# Patient Record
Sex: Male | Born: 1950 | Race: Black or African American | Hispanic: No | Marital: Married | Smoking: Never smoker
Health system: Southern US, Community
[De-identification: ages and names within clinical notes are randomized; demographics above are authoritative.]

## PROBLEM LIST (undated history)

## (undated) DIAGNOSIS — I10 Essential (primary) hypertension: Secondary | ICD-10-CM

## (undated) DIAGNOSIS — D573 Sickle-cell trait: Secondary | ICD-10-CM

## (undated) DIAGNOSIS — E119 Type 2 diabetes mellitus without complications: Secondary | ICD-10-CM

## (undated) HISTORY — PX: ORIF FEMUR FRACTURE: SHX2119

## (undated) HISTORY — PX: COLONOSCOPY: SHX174

## (undated) HISTORY — PX: POLYPECTOMY: SHX149

## (undated) HISTORY — DX: Sickle-cell trait: D57.3

---

## 2003-01-13 ENCOUNTER — Emergency Department (HOSPITAL_COMMUNITY): Admission: EM | Admit: 2003-01-13 | Discharge: 2003-01-13 | Payer: Self-pay | Admitting: Emergency Medicine

## 2003-01-13 ENCOUNTER — Encounter: Payer: Self-pay | Admitting: Family Medicine

## 2008-10-06 ENCOUNTER — Encounter: Admission: RE | Admit: 2008-10-06 | Discharge: 2008-10-06 | Payer: Self-pay | Admitting: Family Medicine

## 2010-11-11 ENCOUNTER — Other Ambulatory Visit: Payer: Self-pay | Admitting: Family Medicine

## 2010-11-11 ENCOUNTER — Ambulatory Visit
Admission: RE | Admit: 2010-11-11 | Discharge: 2010-11-11 | Disposition: A | Discharge status: Home / Self Care | Payer: BC Managed Care – PPO | Source: Ambulatory Visit | Attending: Family Medicine | Admitting: Family Medicine

## 2010-11-11 DIAGNOSIS — I10 Essential (primary) hypertension: Secondary | ICD-10-CM

## 2011-08-10 ENCOUNTER — Encounter (HOSPITAL_COMMUNITY): Payer: Self-pay | Admitting: *Deleted

## 2011-08-10 ENCOUNTER — Emergency Department (INDEPENDENT_AMBULATORY_CARE_PROVIDER_SITE_OTHER)
Admission: EM | Admit: 2011-08-10 | Discharge: 2011-08-10 | Disposition: A | Discharge status: Home / Self Care | Payer: BC Managed Care – PPO | Source: Home / Self Care | Attending: Emergency Medicine | Admitting: Emergency Medicine

## 2011-08-10 DIAGNOSIS — L02219 Cutaneous abscess of trunk, unspecified: Secondary | ICD-10-CM

## 2011-08-10 DIAGNOSIS — L02214 Cutaneous abscess of groin: Secondary | ICD-10-CM

## 2011-08-10 MED ORDER — CHLORHEXIDINE GLUCONATE 4 % EX LIQD: 60.0000 mL | Freq: Every day | CUTANEOUS | Status: AC | PRN

## 2011-08-10 MED ORDER — BACITRACIN 500 UNIT/GM EX OINT: 1.0000 "application " | TOPICAL_OINTMENT | Freq: Once | CUTANEOUS | Status: DC

## 2011-08-10 MED ORDER — HYDROCODONE-ACETAMINOPHEN 5-325 MG PO TABS: 2.0000 | ORAL_TABLET | ORAL | Status: AC | PRN

## 2011-08-10 MED ORDER — IBUPROFEN 600 MG PO TABS: 600.0000 mg | ORAL_TABLET | Freq: Four times a day (QID) | ORAL | Status: AC | PRN

## 2011-08-10 MED ORDER — DOXYCYCLINE HYCLATE 100 MG PO CAPS: 100.0000 mg | ORAL_CAPSULE | Freq: Two times a day (BID) | ORAL | Status: AC

## 2011-08-10 NOTE — ED Provider Notes (Signed)
History     CSN: [id number]  Arrival date & time 08/10/11  1737   First MD Initiated Contact with Patient 08/10/11 1738      Chief Complaint  Patient presents with  . Abscess    (Consider location/radiation/quality/duration/timing/severity/associated sxs/prior treatment) HPI Comments: Patient presents with 2 small, painful erythematous, pustules in his left groin starting several days ago. Does not recall any trauma to the area. States that he "popped" them, with some purulent drainage. No other lesions anywhere else. No nausea, vomiting, urinary complaints, penile discharge. Similar lesions before, but they usually drain and resolve on their own. Patient is not diabetic.   ROS as noted in HPI. All other ROS negative.   Patient is a 61 y.o. male presenting with abscess. The history is provided by the patient. No language interpreter was used.  Abscess  This is a recurrent problem. The current episode started less than one week ago. The onset was gradual. The abscess is present on the groin. The abscess is characterized by itchiness and redness. The patient was exposed to OTC medications. The abscess first occurred at home. Pertinent negatives include no fever.    History reviewed. No pertinent past medical history.  History reviewed. No pertinent past surgical history.  No family history on file.  History  Substance Use Topics  . Smoking status: Never Smoker   . Smokeless tobacco: Not on file  . Alcohol Use: No      Review of Systems  Constitutional: Negative for fever.    Allergies  Review of patient's allergies indicates no known allergies.  Home Medications   Current Outpatient Rx  Name Route Sig Dispense Refill  . CHLORHEXIDINE GLUCONATE 4 % EX LIQD Topical Apply 60 mLs (4 application total) topically daily as needed. Use daily when bathing for 1-2 weeks 120 mL 0  . DOXYCYCLINE HYCLATE 100 MG PO CAPS Oral Take 1 capsule (100 mg total) by mouth 2 (two) times  daily. 20 capsule 0  . HYDROCODONE-ACETAMINOPHEN 5-325 MG PO TABS Oral Take 2 tablets by mouth every 4 (four) hours as needed for pain. 20 tablet 0  . IBUPROFEN 600 MG PO TABS Oral Take 1 tablet (600 mg total) by mouth every 6 (six) hours as needed for pain. 30 tablet 0    BP 121/74  Pulse 76  Temp(Src) 97.8 F (36.6 C) (Oral)  Resp 16  SpO2 97%  Physical Exam  Nursing note and vitals reviewed. Constitutional: He is oriented to person, place, and time. He appears well-developed and well-nourished.  HENT:  Head: Normocephalic and atraumatic.  Eyes: Conjunctivae and EOM are normal.  Neck: Normal range of motion.  Cardiovascular: Normal rate.   Pulmonary/Chest: Effort normal. No respiratory distress.  Abdominal: He exhibits no distension.  Genitourinary: Penis normal.    Uncircumcised. No penile erythema or penile tenderness. No discharge found.  Musculoskeletal: Normal range of motion.  Lymphadenopathy:       Left: No inguinal adenopathy present.  Neurological: He is alert and oriented to person, place, and time.  Skin: Skin is warm and dry. No rash noted.  Psychiatric: He has a normal mood and affect. His behavior is normal.    ED Course  INCISION AND DRAINAGE Date/Time: 08/10/2011 7:43 PM Performed by: Luiz Blare Authorized by: Luiz Blare Consent: Verbal consent obtained. Risks and benefits: risks, benefits and alternatives were discussed Consent given by: patient Patient understanding: patient states understanding of the procedure being performed Patient consent: the patient's understanding of  the procedure matches consent given Site marked: the operative site was marked Required items: required blood products, implants, devices, and special equipment available Patient identity confirmed: verbally with patient Type: abscess Location: left groin. Local anesthetic: co-phenylcaine spray Patient sedated: no Scalpel size: 11 Incision type: single  straight Complexity: simple Drainage: purulent Drainage amount: scant Wound treatment: wound left open Patient tolerance: Patient tolerated the procedure well with no immediate complications. Comments: Bacitracin and sterile dressing applied   (including critical care time)  Labs Reviewed - No data to display No results found.   1. Abscess of groin, left     MDM  2 small furuncles in groin. Doxycycline, warm compresses, pain medication. Sent off wound culture. He is to followup with Dr. Parke Simmers or here in several days if no significant improvement. Patient agrees with plan  Luiz Blare, MD 08/13/11 2136

## 2011-08-10 NOTE — Discharge Instructions (Signed)
Return here or follow up with your doctor in 2 days for a wound check. Return sooner if you get worse, have a fever >100.4, or any other concerns. Take the medication as written. Take 1 gram of tylenol with the motrin up to 4 times a day as needed for pain and fever. This is an effective combination for pain. Take the hydrocodone/norco only for severe pain. Do not take the tylenol and hydrocodone/norco as they both have tylenol in them and too much can hurt your liver. Return if you get worse, have a persistent fever >100.4, or for any concerns.   

## 2011-08-10 NOTE — ED Notes (Signed)
Pt is here with complaints of "boil" on left groin.  Pt states area had two pimple like bumps that he popped the night before last.  Area now appears crusted.

## 2014-01-20 ENCOUNTER — Ambulatory Visit
Admission: RE | Admit: 2014-01-20 | Discharge: 2014-01-20 | Disposition: A | Discharge status: Home / Self Care | Payer: BC Managed Care – PPO | Source: Ambulatory Visit | Attending: Family Medicine | Admitting: Family Medicine

## 2014-01-20 ENCOUNTER — Other Ambulatory Visit: Payer: Self-pay | Admitting: Family Medicine

## 2014-01-20 DIAGNOSIS — R062 Wheezing: Secondary | ICD-10-CM

## 2014-01-20 DIAGNOSIS — R05 Cough: Secondary | ICD-10-CM

## 2014-01-20 DIAGNOSIS — R059 Cough, unspecified: Secondary | ICD-10-CM

## 2014-11-23 ENCOUNTER — Encounter (HOSPITAL_COMMUNITY): Payer: Self-pay | Admitting: Emergency Medicine

## 2014-11-23 ENCOUNTER — Emergency Department (INDEPENDENT_AMBULATORY_CARE_PROVIDER_SITE_OTHER)
Admission: EM | Admit: 2014-11-23 | Discharge: 2014-11-23 | Disposition: A | Discharge status: Home / Self Care | Payer: BLUE CROSS/BLUE SHIELD | Source: Home / Self Care | Attending: Family Medicine | Admitting: Family Medicine

## 2014-11-23 DIAGNOSIS — N4889 Other specified disorders of penis: Secondary | ICD-10-CM

## 2014-11-23 DIAGNOSIS — N489 Disorder of penis, unspecified: Secondary | ICD-10-CM

## 2014-11-23 HISTORY — DX: Type 2 diabetes mellitus without complications: E11.9

## 2014-11-23 HISTORY — DX: Essential (primary) hypertension: I10

## 2014-11-23 MED ORDER — CLINDAMYCIN HCL 300 MG PO CAPS
300.0000 mg | ORAL_CAPSULE | Freq: Three times a day (TID) | ORAL | Status: DC
Start: 2014-11-23 — End: 2016-11-08

## 2014-11-23 NOTE — Discharge Instructions (Signed)
You have developed an infected sore that will require antibiotics to heal. You can use vitamin E ointment to speed up the healing process. In the future just use Neosporin or bacitracin ointment whenever these start.

## 2014-11-23 NOTE — ED Provider Notes (Signed)
CSN: [id number]     Arrival date & time 11/23/14  1531 History   First MD Initiated Contact with Patient 11/23/14 1645     Chief Complaint  Patient presents with  . Abscess   (Consider location/radiation/quality/duration/timing/severity/associated sxs/prior Treatment) HPI  Penile lesion: Present for 1 week. Constant. Open and painful. Over-the-counter ointment without improvement. Family states that it was probably not an antibiotic ointment. Denies any groin lumps bumps, unintentional weight loss, night sweats, fevers, abdominal pain, penile discharge, nausea, vomiting, constipation, no diarrhea. Problem is nonradiating. Single lesion on the dorsum of the penis.  Past Medical History  Diagnosis Date  . Hypertension   . Diabetes mellitus without complication    History reviewed. No pertinent past surgical history. Family History  Problem Relation Age of Onset  . Asthma Neg Hx   . Cancer Neg Hx   . Diabetes Neg Hx   . Heart failure Neg Hx   . Hyperlipidemia Neg Hx    Social History  Substance Use Topics  . Smoking status: Never Smoker   . Smokeless tobacco: None  . Alcohol Use: No    Review of Systems  Allergies  Review of patient's allergies indicates no known allergies.  Home Medications   Prior to Admission medications   Medication Sig Start Date End Date Taking? Authorizing Provider  clindamycin (CLEOCIN) 300 MG capsule Take 1 capsule (300 mg total) by mouth 3 (three) times daily. 11/23/14   Waldemar Dickens, MD   Meds Ordered and Administered this Visit  Medications - No data to display  BP 121/69 mmHg  Pulse 68  Temp(Src) 97.4 F (36.3 C) (Oral)  Resp 18  SpO2 95% No data found.   Physical Exam Physical Exam  Constitutional: oriented to person, place, and time. appears well-developed and well-nourished. No distress.  HENT:  Head: Normocephalic and atraumatic.  Eyes: EOMI. PERRL.  Neck: Normal range of motion.  Cardiovascular: RRR, no m/r/g, 2+ distal  pulses,  Pulmonary/Chest: Effort normal and breath sounds normal. No respiratory distress.  Abdominal: Soft. Bowel sounds are normal. NonTTP, no distension.  Musculoskeletal: Normal range of motion. Non ttp, no effusion.  Neurological: alert and oriented to person, place, and time.  Skin: Skin is warm. No rash noted. non diaphoretic.  Psychiatric: normal mood and affect. behavior is normal. Judgment and thought content normal.  GU: No groin adenopathy, circumcised, no penile discharge, testicles normal, single 1 x 1.5 cm ulcerated lesion on the dorsum of the proximal he now shaft.  ED Course  Procedures (including critical care time)  Labs Review Labs Reviewed  CYTOLOGY, (ORAL, ANAL, URETHRAL) ANCILLARY ONLY    Imaging Review No results found.   Visual Acuity Review  Right Eye Distance:   Left Eye Distance:   Bilateral Distance:    Right Eye Near:   Left Eye Near:    Bilateral Near:         MDM   1. Penile lesion    Folliculitis which has turned into open draining abscess versus HSV. HSV culture sent. Start clindamycin.    Waldemar Dickens, MD 11/23/14 [phone]

## 2014-11-23 NOTE — ED Notes (Signed)
C/o abscess in groin area for four days  States area did drain with red pus States area is red

## 2014-11-24 NOTE — ED Notes (Signed)
New lab orders in place, discussed w Maudie Mercury in lab

## 2014-11-24 NOTE — ED Notes (Signed)
procedural collection issues required reorder of lab study

## 2014-11-26 LAB — HERPES SIMPLEX VIRUS CULTURE
Culture: NOT DETECTED
Special Requests: NORMAL

## 2014-12-08 NOTE — ED Notes (Signed)
Lab results negative, no further action required

## 2016-09-18 ENCOUNTER — Encounter: Payer: Self-pay | Admitting: Internal Medicine

## 2016-09-18 ENCOUNTER — Ambulatory Visit: Payer: Self-pay | Attending: Internal Medicine | Admitting: Internal Medicine

## 2016-09-18 VITALS — BP 169/87 | HR 60 | Temp 98.4°F | Resp 16 | Wt 180.0 lb

## 2016-09-18 DIAGNOSIS — Z1211 Encounter for screening for malignant neoplasm of colon: Secondary | ICD-10-CM

## 2016-09-18 DIAGNOSIS — Z125 Encounter for screening for malignant neoplasm of prostate: Secondary | ICD-10-CM

## 2016-09-18 DIAGNOSIS — R109 Unspecified abdominal pain: Secondary | ICD-10-CM

## 2016-09-18 DIAGNOSIS — E119 Type 2 diabetes mellitus without complications: Secondary | ICD-10-CM

## 2016-09-18 DIAGNOSIS — M1990 Unspecified osteoarthritis, unspecified site: Secondary | ICD-10-CM | POA: Insufficient documentation

## 2016-09-18 DIAGNOSIS — M25562 Pain in left knee: Secondary | ICD-10-CM | POA: Insufficient documentation

## 2016-09-18 DIAGNOSIS — Z8249 Family history of ischemic heart disease and other diseases of the circulatory system: Secondary | ICD-10-CM | POA: Insufficient documentation

## 2016-09-18 DIAGNOSIS — Z833 Family history of diabetes mellitus: Secondary | ICD-10-CM | POA: Insufficient documentation

## 2016-09-18 DIAGNOSIS — I10 Essential (primary) hypertension: Secondary | ICD-10-CM

## 2016-09-18 DIAGNOSIS — G8929 Other chronic pain: Secondary | ICD-10-CM | POA: Insufficient documentation

## 2016-09-18 LAB — GLUCOSE, POCT (MANUAL RESULT ENTRY): POC Glucose: 98 mg/dl (ref 70–99)

## 2016-09-18 LAB — POCT GLYCOSYLATED HEMOGLOBIN (HGB A1C): HEMOGLOBIN A1C: 5.1

## 2016-09-18 MED ORDER — LISINOPRIL 10 MG PO TABS: 10.0000 mg | ORAL_TABLET | Freq: Every day | ORAL | 3 refills | Status: DC

## 2016-09-18 MED ORDER — DICLOFENAC SODIUM 1 % TD GEL: 2.0000 g | Freq: Four times a day (QID) | TRANSDERMAL | 3 refills | Status: DC

## 2016-09-18 MED FILL — DICLOFENAC SODIUM 1% GEL: 1 | 12 days supply | Qty: 100 | Fill #0

## 2016-09-18 MED FILL — ?LISINOPRIL 10 MG TABLET: 10 | 30 days supply | Qty: 30 | Fill #0

## 2016-09-18 NOTE — Progress Notes (Signed)
Patient ID: Benjamin Nunez, male    DOB: October 10, 1950  MRN: [id number]  CC: New Patient (Initial Visit)   Subjective: Benjamin Nunez is a 66 y.o. male who presents for new pt visit. PCP was Dr. Criss Rosales. Last seen 1 yr ago. Loss insurance. His concerns today include:  Hx of HTN, DM, chronic LT knee pain due to arthritis.  1. HTN: -was on an ARB/HCTZ combo. Out of meds x 1 yr -does not check BP -he limits salt in food. -endorses HA/dizziness about QOWk. -no LE edema/CP/SOB  2. DM or pre-DM: dx 2-3 yrs. Never on meds. Always diet control. No glucometer to check blood sugars. Very careful with his foot choices. He avoids sweet drinks -walking 3-4 blocks going and coming in neighborhood QOD.  -some twitching in LT eye lid. No blurred vision. -last eye exam was 1 yr ago.   3. Intermittent pain RT flank -no initiating factors identify. -flare last evening but not hurting now.  -no dysuria  Patient Active Problem List   Diagnosis Date Noted  . Controlled type 2 diabetes mellitus without complication, without long-term current use of insulin ([place]) 09/18/2016  . Essential hypertension 09/18/2016     Current Outpatient Prescriptions on File Prior to Visit  Medication Sig Dispense Refill  . clindamycin (CLEOCIN) 300 MG capsule Take 1 capsule (300 mg total) by mouth 3 (three) times daily. 21 capsule 0   No current facility-administered medications on file prior to visit.     No Known Allergies  Social History   Social History  . Marital status: Single    Spouse name: N/A  . Number of children: N/A  . Years of education: N/A   Occupational History  . Not on file.   Social History Main Topics  . Smoking status: Never Smoker  . Smokeless tobacco: Never Used  . Alcohol use No  . Drug use: No  . Sexual activity: Not on file   Other Topics Concern  . Not on file   Social History Narrative  . No narrative on file    Family History  Problem Relation Age of Onset  .  Diabetes Brother   . Hypertension Brother   . Asthma Neg Hx   . Cancer Neg Hx   . Heart failure Neg Hx   . Hyperlipidemia Neg Hx     Past Surgical History:  Procedure Laterality Date  . ORIF FEMUR FRACTURE  1970s    ROS: Review of Systems  Constitutional: Negative for activity change and appetite change.  Respiratory: Negative for cough, chest tightness and shortness of breath.   Cardiovascular: Negative for chest pain.  Gastrointestinal: Negative for anal bleeding.       Never had colonoscopy.  Genitourinary: Positive for flank pain. Negative for difficulty urinating, discharge and frequency.  Neurological: Positive for dizziness (occasionally).  Psychiatric/Behavioral: Negative for dysphoric mood. The patient is not nervous/anxious.     PHYSICAL EXAM: BP (!) 169/87   Pulse 60   Temp 98.4 F (36.9 C) (Oral)   Resp 16   Wt 180 lb (81.6 kg)   SpO2 97%   Physical Exam General appearance - alert, well appearing, older African-American male and in no distress Mental status - alert, oriented to person, place, and time, normal mood, behavior, speech, dress, motor activity, and thought processes Eyes - pupils equal and reactive, extraocular eye movements intact Mouth - mucous membranes moist, pharynx normal without lesions Neck - supple, no significant adenopathy Chest - clear to  auscultation, no wheezes, rales or rhonchi, symmetric air entry Heart - normal rate, regular rhythm, normal S1, S2, no murmurs, rubs, clicks or gallops Musculoskeletal - mild tenderness on palpation of lower rib cage posterior left side Extremities - peripheral pulses normal, no pedal edema, no clubbing or cyanosis  Results for orders placed or performed in visit on 09/18/16  POCT glucose (manual entry)  Result Value Ref Range   POC Glucose 98 70 - 99 mg/dl  POCT glycosylated hemoglobin (Hb A1C)  Result Value Ref Range   Hemoglobin A1C 5.1     Depression screen [facility] 2/9 09/18/2016  Decreased  Interest 2  Down, Depressed, Hopeless 0  PHQ - 2 Score 2  Altered sleeping 0  Tired, decreased energy 2  Change in appetite 2  Feeling bad or failure about yourself  2  Trouble concentrating 2  Moving slowly or fidgety/restless 0  Suicidal thoughts 0  PHQ-9 Score 10   GAD 7 : Generalized Anxiety Score 09/18/2016  Nervous, Anxious, on Edge 0  Control/stop worrying (No Data)  Worry too much - different things 2  Trouble relaxing 2  Restless 2  Easily annoyed or irritable 1  Afraid - awful might happen 0   ASSESSMENT AND PLAN: 1. Essential hypertension Uncontrolled. Start lisinopril. DASH diet discussed. - lisinopril (PRINIVIL,ZESTRIL) 10 MG tablet; Take 1 tablet (10 mg total) by mouth daily.  Dispense: 90 tablet; Refill: 3  2. Controlled type 2 diabetes mellitus without complication, without long-term current use of insulin ([place]) -I question whether the patient was diagnosed with diabetes versus prediabetes. He will sign a release for me to get his records from previous PCP. Encourage patient to continue healthy eating and regular exercise - POCT glucose (manual entry) - POCT glycosylated hemoglobin (Hb A1C) - Comprehensive metabolic panel - CBC - Lipid panel - Microalbumin / creatinine urine ratio  3. Acute flank pain -Likely MSK in nature. - diclofenac sodium (VOLTAREN) 1 % GEL; Apply 2 g topically 4 (four) times daily.  Dispense: 100 g; Refill: 3  4. Screen for colon cancer - Ambulatory referral to Gastroenterology  5. Prostate cancer screening - PSA  Patient to sign a release for [place] to get his records from previous PCP. Also instructed front desk to give him forms to apply for [facility] card/[facility] discount.  Patient was given the opportunity to ask questions.  Patient verbalized understanding of the plan and was able to repeat key elements of the plan.   Orders Placed This Encounter  Procedures  . Comprehensive metabolic panel  . CBC  . Lipid panel  . PSA  .  Microalbumin / creatinine urine ratio  . Ambulatory referral to Gastroenterology  . POCT glucose (manual entry)  . POCT glycosylated hemoglobin (Hb A1C)     Requested Prescriptions   Signed Prescriptions Disp Refills  . lisinopril (PRINIVIL,ZESTRIL) 10 MG tablet 90 tablet 3    Sig: Take 1 tablet (10 mg total) by mouth daily.  . diclofenac sodium (VOLTAREN) 1 % GEL 100 g 3    Sig: Apply 2 g topically 4 (four) times daily.    Return in about 8 weeks (around 11/13/2016).  Karle Plumber, MD, FACP

## 2016-09-18 NOTE — Patient Instructions (Addendum)
Please sign release to get old records from [facility] here in [place]. Please give appointment with Miss Luciana Axe to get signed up for the [facility] card and [facility] discount. Give appointment with Chuck Hint in 2 weeks for blood pressure check.  Start lisinopril 10 mg daily for better blood pressure control

## 2016-09-19 ENCOUNTER — Encounter: Payer: Self-pay | Admitting: Gastroenterology

## 2016-09-19 LAB — MICROALBUMIN / CREATININE URINE RATIO
Creatinine, Urine: 83.2 mg/dL
Microalb/Creat Ratio: 106.9 mg/g creat — ABNORMAL HIGH (ref 0.0–30.0)
Microalbumin, Urine: 88.9 ug/mL

## 2016-09-19 LAB — COMPREHENSIVE METABOLIC PANEL
ALBUMIN: 4.1 g/dL (ref 3.6–4.8)
ALK PHOS: 57 IU/L (ref 39–117)
ALT: 12 IU/L (ref 0–44)
AST: 12 IU/L (ref 0–40)
Albumin/Globulin Ratio: 1.2 (ref 1.2–2.2)
BILIRUBIN TOTAL: 0.4 mg/dL (ref 0.0–1.2)
BUN/Creatinine Ratio: 9 — ABNORMAL LOW (ref 10–24)
BUN: 9 mg/dL (ref 8–27)
CHLORIDE: 107 mmol/L — AB (ref 96–106)
CO2: 24 mmol/L (ref 20–29)
Calcium: 9.6 mg/dL (ref 8.6–10.2)
Creatinine, Ser: 0.96 mg/dL (ref 0.76–1.27)
GFR calc Af Amer: 96 mL/min/{1.73_m2} (ref >59)
GFR calc non Af Amer: 83 mL/min/{1.73_m2} (ref >59)
GLUCOSE: 84 mg/dL (ref 65–99)
Globulin, Total: 3.3 g/dL (ref 1.5–4.5)
POTASSIUM: 3.9 mmol/L (ref 3.5–5.2)
SODIUM: 146 mmol/L — AB (ref 134–144)
Total Protein: 7.4 g/dL (ref 6.0–8.5)

## 2016-09-19 LAB — LIPID PANEL
CHOLESTEROL TOTAL: 129 mg/dL (ref 100–199)
Chol/HDL Ratio: 3.1 ratio (ref 0.0–5.0)
HDL: 41 mg/dL (ref >39)
LDL CALC: 73 mg/dL (ref 0–99)
TRIGLYCERIDES: 74 mg/dL (ref 0–149)
VLDL CHOLESTEROL CAL: 15 mg/dL (ref 5–40)

## 2016-09-19 LAB — CBC
HEMOGLOBIN: 14.2 g/dL (ref 13.0–17.7)
Hematocrit: 42.4 % (ref 37.5–51.0)
MCH: 27.4 pg (ref 26.6–33.0)
MCHC: 33.5 g/dL (ref 31.5–35.7)
MCV: 82 fL (ref 79–97)
PLATELETS: 166 10*3/uL (ref 150–379)
RBC: 5.18 x10E6/uL (ref 4.14–5.80)
RDW: 16 % — ABNORMAL HIGH (ref 12.3–15.4)
WBC: 4.4 10*3/uL (ref 3.4–10.8)

## 2016-09-19 LAB — PSA: Prostate Specific Ag, Serum: 3 ng/mL (ref 0.0–4.0)

## 2016-09-26 ENCOUNTER — Telehealth: Payer: Self-pay | Admitting: Internal Medicine

## 2016-09-26 ENCOUNTER — Telehealth: Payer: Self-pay

## 2016-09-26 NOTE — Telephone Encounter (Signed)
Pt. Wife returned call and was given pt. Results. Pt. Wife has further questions regarding pt. Results. Please f/u.

## 2016-09-26 NOTE — Telephone Encounter (Signed)
Contacted pt to go over lab results a lady picked up the phone and I asked for patient lady said hold on and hung up   If pt calls back please give results: cholesterol level was normal. His blood count and liver function tests were normal. He has small amount of protein in the urine; normally you should not. Good blood pressure control will help to protect the kidneys.

## 2016-09-28 NOTE — Telephone Encounter (Signed)
Pt. Wife called requesting to speak to the nurse regarding pt. Results. Please f/u with pt.

## 2016-09-29 NOTE — Telephone Encounter (Signed)
Spoke with pt and pt states how can he get protein out of his urine and how did that happen. Informed pt that out of control bp and not drinking plenty of water can make you have protein in urine. Pt states he drink water all the time but he was out of his bp medicine for months because of financial issues. I informed pt that once he get a tighter control of his bp and drink plenty of water then the protein in his urine should go away. Pt states he understands and he greatly appreciate the time that I took out to explain the causes to him

## 2016-10-09 ENCOUNTER — Ambulatory Visit: Payer: Medicare Other | Attending: Internal Medicine | Admitting: *Deleted

## 2016-10-09 VITALS — BP 114/73 | HR 69

## 2016-10-09 DIAGNOSIS — I10 Essential (primary) hypertension: Secondary | ICD-10-CM | POA: Insufficient documentation

## 2016-10-16 MED FILL — LISINOPRIL 10 MG TABLET: 10 | 30 days supply | Qty: 30 | Fill #1

## 2016-11-08 ENCOUNTER — Encounter: Payer: Self-pay | Admitting: Gastroenterology

## 2016-11-08 ENCOUNTER — Ambulatory Visit (AMBULATORY_SURGERY_CENTER): Payer: Self-pay | Admitting: *Deleted

## 2016-11-08 VITALS — Ht 67.5 in | Wt 174.6 lb

## 2016-11-08 DIAGNOSIS — Z1211 Encounter for screening for malignant neoplasm of colon: Secondary | ICD-10-CM

## 2016-11-08 MED ORDER — NA SULFATE-K SULFATE-MG SULF 17.5-3.13-1.6 GM/177ML PO SOLN: 1.0000 [IU] | Freq: Once | ORAL | 0 refills | Status: AC

## 2016-11-08 NOTE — Progress Notes (Signed)
No egg or soy allergy known to patient  No issues with past sedation with any surgeries  or procedures, no intubation problems  No diet pills per patient No home 02 use per patient  No blood thinners per patient  Pt denies issues with constipation  No A fib or A flutter  EMMI video sent to pt's e mail  

## 2016-11-13 MED FILL — LISINOPRIL 10 MG TABLET: 10 | 30 days supply | Qty: 30 | Fill #2

## 2016-11-21 ENCOUNTER — Encounter: Payer: Self-pay | Admitting: Internal Medicine

## 2016-11-21 ENCOUNTER — Ambulatory Visit: Payer: Medicare Other | Attending: Internal Medicine | Admitting: Internal Medicine

## 2016-11-21 VITALS — BP 144/79 | HR 62 | Temp 97.8°F | Resp 18 | Ht 67.0 in | Wt 174.2 lb

## 2016-11-21 DIAGNOSIS — Z79899 Other long term (current) drug therapy: Secondary | ICD-10-CM | POA: Diagnosis not present

## 2016-11-21 DIAGNOSIS — I10 Essential (primary) hypertension: Secondary | ICD-10-CM | POA: Insufficient documentation

## 2016-11-21 DIAGNOSIS — R7303 Prediabetes: Secondary | ICD-10-CM | POA: Insufficient documentation

## 2016-11-21 DIAGNOSIS — Z2821 Immunization not carried out because of patient refusal: Secondary | ICD-10-CM | POA: Diagnosis not present

## 2016-11-21 LAB — GLUCOSE, POCT (MANUAL RESULT ENTRY): POC Glucose: 88 mg/dl (ref 70–99)

## 2016-11-21 NOTE — Progress Notes (Signed)
.  h 

## 2016-11-21 NOTE — Patient Instructions (Signed)
Check blood pressure 3 x a week. Goal is to be 140/90 or lower.

## 2016-11-21 NOTE — Progress Notes (Signed)
Patient ID: Benjamin Nunez, male    DOB: Oct 08, 1950  MRN: [id number]  CC: Follow-up   Subjective: Benjamin Nunez is a 66 y.o. male who presents for 2 mth f/u chronic ds management His concerns today include:  HTN, prediabetes  1.  Patient has colonoscopy scheduled for tomorrow  I never did receive records from previous PCP. Patient states he didn't sign a release on last visit  2.pre-DM: . I suspect patient was prediabetes and not diabetes. He has never been on medication for diabetes. Does not check BS. -Does well with watching what he eats -exercise walking 1 mile QOD  3. HTN:  No CP/SOB Has a device at home but does not check blood pressure Limit salt in the foods Compliant with lisinopril  HM: Does not want flu or pneumonia vaccine  Patient Active Problem List   Diagnosis Date Noted  . Prediabetes 11/21/2016  . Essential hypertension 09/18/2016     Current Outpatient Prescriptions on File Prior to Visit  Medication Sig Dispense Refill  . clindamycin (CLEOCIN) 300 MG capsule Take 300 mg by mouth 3 (three) times daily.    . diclofenac sodium (VOLTAREN) 1 % GEL Apply 2 g topically 4 (four) times daily.    Marland Kitchen lisinopril (PRINIVIL,ZESTRIL) 10 MG tablet Take 10 mg by mouth daily.     No current facility-administered medications on file prior to visit.     No Known Allergies  Social History   Social History  . Marital status: Single    Spouse name: N/A  . Number of children: N/A  . Years of education: N/A   Occupational History  . Not on file.   Social History Main Topics  . Smoking status: Never Smoker  . Smokeless tobacco: Never Used  . Alcohol use Yes     Comment: ocasionally  . Drug use: No  . Sexual activity: Not on file   Other Topics Concern  . Not on file   Social History Narrative  . No narrative on file    Family History  Problem Relation Age of Onset  . Diabetes Brother   . Hypertension Brother   . Asthma Neg Hx   . Cancer Neg  Hx   . Heart failure Neg Hx   . Hyperlipidemia Neg Hx   . Colon cancer Neg Hx   . Colon polyps Neg Hx   . Esophageal cancer Neg Hx   . Stomach cancer Neg Hx   . Rectal cancer Neg Hx     Past Surgical History:  Procedure Laterality Date  . ORIF FEMUR FRACTURE  1970s    ROS: Review of Systems As stated above. PHYSICAL EXAM: BP (!) 144/79 (BP Location: Left Arm, Patient Position: Sitting, Cuff Size: Normal)   Pulse 62   Temp 97.8 F (36.6 C) (Oral)   Resp 18   Ht 5\' 7"  (1.702 m)   Wt 174 lb 3.2 oz (79 kg)   SpO2 98%   BMI 27.28 kg/m   Physical Exam  General appearance - alert, well appearing, and in no distress Mental status - alert, oriented to person, place, and time, normal mood, behavior, speech, dress, motor activity, and thought processes Mouth - mucous membranes moist, pharynx normal without lesions Neck - supple, no significant adenopathy Chest - clear to auscultation, no wheezes, rales or rhonchi, symmetric air entry Heart - normal rate, regular rhythm, normal S1, S2, no murmurs, rubs, clicks or gallops Extremities - peripheral pulses normal, no pedal edema, no clubbing  or cyanosis  Results for orders placed or performed in visit on 11/21/16  Glucose (CBG)  Result Value Ref Range   POC Glucose 88 70 - 99 mg/dl    ASSESSMENT AND PLAN: 1. Essential hypertension Close to goal. Advised patient to check blood pressure 3 times a week and record readings. I went over goal of 140/90 or lower. If he is running higher than that consistently he will come back in much sooner than 3-4 months Continue DASH diet - Glucose (CBG)  2. Prediabetes Continue to encourage healthy eating and regular exercise. Blood sugar was low at 70 when he first came in. Patient given a small orange juice. Repeat blood sugar was 88  3. Influenza vaccination declined   Patient was given the opportunity to ask questions.  Patient verbalized understanding of the plan and was able to repeat  key elements of the plan.   Orders Placed This Encounter  Procedures  . Glucose (CBG)     Requested Prescriptions    No prescriptions requested or ordered in this encounter    Return in about 4 months (around 03/23/2017).  Karle Plumber, MD, FACP

## 2016-11-22 ENCOUNTER — Ambulatory Visit (AMBULATORY_SURGERY_CENTER): Payer: Medicare Other | Admitting: Gastroenterology

## 2016-11-22 ENCOUNTER — Encounter: Payer: Self-pay | Admitting: Gastroenterology

## 2016-11-22 VITALS — BP 101/74 | HR 75 | Temp 95.5°F | Resp 16 | Ht 67.0 in | Wt 174.0 lb

## 2016-11-22 DIAGNOSIS — D123 Benign neoplasm of transverse colon: Secondary | ICD-10-CM | POA: Diagnosis not present

## 2016-11-22 DIAGNOSIS — Z1211 Encounter for screening for malignant neoplasm of colon: Secondary | ICD-10-CM | POA: Diagnosis not present

## 2016-11-22 DIAGNOSIS — D120 Benign neoplasm of cecum: Secondary | ICD-10-CM

## 2016-11-22 MED ORDER — SODIUM CHLORIDE 0.9 % IV SOLN: 500.0000 mL | INTRAVENOUS | Status: DC

## 2016-11-22 NOTE — Patient Instructions (Signed)
YOU HAD AN ENDOSCOPIC PROCEDURE TODAY AT [facility]:   Refer to the procedure report that was given to you for any specific questions about what was found during the examination.  If the procedure report does not answer your questions, please call your gastroenterologist to clarify.  If you requested that your care partner not be given the details of your procedure findings, then the procedure report has been included in a sealed envelope for you to review at your convenience later.  YOU SHOULD EXPECT: Some feelings of bloating in the abdomen. Passage of more gas than usual.  Walking can help get rid of the air that was put into your GI tract during the procedure and reduce the bloating. If you had a lower endoscopy (such as a colonoscopy or flexible sigmoidoscopy) you may notice spotting of blood in your stool or on the toilet paper. If you underwent a bowel prep for your procedure, you may not have a normal bowel movement for a few days.  Please Note:  You might notice some irritation and congestion in your nose or some drainage.  This is from the oxygen used during your procedure.  There is no need for concern and it should clear up in a day or so.  SYMPTOMS TO REPORT IMMEDIATELY:   Following lower endoscopy (colonoscopy or flexible sigmoidoscopy):  Excessive amounts of blood in the stool  Significant tenderness or worsening of abdominal pains  Swelling of the abdomen that is new, acute  Fever of 100F or higher   For urgent or emergent issues, a gastroenterologist can be reached at any hour by calling [phone].   DIET:  We do recommend a small meal at first, but then you may proceed to your regular diet.  Drink plenty of fluids but you should avoid alcoholic beverages for 24 hours.  ACTIVITY:  You should plan to take it easy for the rest of today and you should NOT DRIVE or use heavy machinery until tomorrow (because of the sedation medicines used during the test).     FOLLOW UP: Our staff will call the number listed on your records the next business day following your procedure to check on you and address any questions or concerns that you may have regarding the information given to you following your procedure. If we do not reach you, we will leave a message.  However, if you are feeling well and you are not experiencing any problems, there is no need to return our call.  We will assume that you have returned to your regular daily activities without incident.  Polyp information given.  NO ASPIRIN CONTAINING PRODUCTS (BC OR GOODY POWDERS) OR NSAIDS (IBUPROFEN, ADVIL, ALEVE, AND MOTRIN) FOR 2 weeks; TYLENOL IS OK TO TAKE  If any biopsies were taken you will be contacted by phone or by letter within the next 1-3 weeks.  Please call us at [phone] if you have not heard about the biopsies in 3 weeks.    SIGNATURES/CONFIDENTIALITY: You and/or your care partner have signed paperwork which will be entered into your electronic medical record.  These signatures attest to the fact that that the information above on your After Visit Summary has been reviewed and is understood.  Full responsibility of the confidentiality of this discharge information lies with you and/or your care-partner.

## 2016-11-22 NOTE — Progress Notes (Signed)
Called to room to assist during endoscopic procedure.  Patient ID and intended procedure confirmed with present staff. Received instructions for my participation in the procedure from the performing physician.  

## 2016-11-22 NOTE — Op Note (Signed)
[facility] Patient Name: Benjamin Nunez Procedure Date: 11/22/2016 9:05 AM MRN: [id number] Endoscopist: Remo Lipps P. Armbruster MD, MD Age: 66 Referring MD:  Date of Birth: 03/01/1951 Gender: Male Account #: [id number] Procedure:                Colonoscopy Indications:              Screening for colorectal malignant neoplasm, This                            is the patient's first colonoscopy Medicines:                Monitored Anesthesia Care Procedure:                Pre-Anesthesia Assessment:                           - Prior to the procedure, a History and Physical                            was performed, and patient medications and                            allergies were reviewed. The patient's tolerance of                            previous anesthesia was also reviewed. The risks                            and benefits of the procedure and the sedation                            options and risks were discussed with the patient.                            All questions were answered, and informed consent                            was obtained. Prior Anticoagulants: The patient has                            taken no previous anticoagulant or antiplatelet                            agents. ASA Grade Assessment: II - A patient with                            mild systemic disease. After reviewing the risks                            and benefits, the patient was deemed in                            satisfactory condition to undergo the procedure.  After obtaining informed consent, the colonoscope                            was passed under direct vision. Throughout the                            procedure, the patient's blood pressure, pulse, and                            oxygen saturations were monitored continuously. The                            Colonoscope was introduced through the anus and                            advanced to the  the cecum, identified by                            appendiceal orifice and ileocecal valve. The                            colonoscopy was performed without difficulty. The                            patient tolerated the procedure well. The quality                            of the bowel preparation was adequate. The                            ileocecal valve, appendiceal orifice, and rectum                            were photographed. Scope In: 9:12:54 AM Scope Out: 9:36:26 AM Scope Withdrawal Time: 0 hours 20 minutes 7 seconds  Total Procedure Duration: 0 hours 23 minutes 32 seconds  Findings:                 The perianal and digital rectal examinations were                            normal.                           A large amount of liquid stool was found in the                            entire colon, making visualization difficult.                            Several minutes were spent lavage of the colon was                            performed using copious amounts of sterile water,  resulting in clearance with good visualization.                           Two sessile polyps were found in the cecum. The                            polyps were 3 to 4 mm in size. These polyps were                            removed with a cold snare. Resection and retrieval                            were complete.                           A 3 mm polyp was found in the transverse colon. The                            polyp was sessile. The polyp was removed with a                            cold snare. Resection and retrieval were complete.                           The exam was otherwise without abnormality on                            direct and retroflexion views. Complications:            No immediate complications. Estimated blood loss:                            Minimal. Estimated Blood Loss:     Estimated blood loss was minimal. Impression:               -  Stool in the entire examined colon, time spent                            lavaging the colon to achieve adequate views.                           - Two 3 to 4 mm polyps in the cecum, removed with a                            cold snare. Resected and retrieved.                           - One 3 mm polyp in the transverse colon, removed                            with a cold snare. Resected and retrieved.                           - The  examination was otherwise normal on direct                            and retroflexion views. Recommendation:           - Patient has a contact number available for                            emergencies. The signs and symptoms of potential                            delayed complications were discussed with the                            patient. Return to normal activities tomorrow.                            Written discharge instructions were provided to the                            patient.                           - Resume previous diet.                           - Continue present medications.                           - Await pathology results.                           - Repeat colonoscopy is recommended for                            surveillance. The colonoscopy date will be                            determined after pathology results from today's                            exam become available for review.                           - No ibuprofen, naproxen, or other non-steroidal                            anti-inflammatory drugs for 2 weeks after polyp                            removal. Remo Lipps P. Armbruster MD, MD 11/22/2016 9:42:17 AM This report has been signed electronically.

## 2016-11-22 NOTE — Progress Notes (Signed)
To recovery, report to RN, VSS. 

## 2016-11-23 ENCOUNTER — Telehealth: Payer: Self-pay

## 2016-11-23 NOTE — Telephone Encounter (Signed)
  Follow up Call-  Call back number 11/22/2016  Post procedure Call Back phone  # [phone]  Permission to leave phone message Yes  Some recent data might be hidden     Patient questions:  Do you have a fever, pain , or abdominal swelling? No. Pain Score  0 *  Have you tolerated food without any problems? Yes.    Have you been able to return to your normal activities? Yes.    Do you have any questions about your discharge instructions: Diet   No. Medications  No. Follow up visit  No.  Do you have questions or concerns about your Care? No.  Actions: * If pain score is 4 or above: No action needed, pain <4.  No problems per pt. maw

## 2016-11-29 ENCOUNTER — Encounter: Payer: Self-pay | Admitting: Gastroenterology

## 2016-12-13 MED FILL — LISINOPRIL 10 MG TABS: 10 | 30 days supply | Qty: 30 | Fill #3

## 2017-01-15 MED FILL — LISINOPRIL 10 MG TABS: 10 | 30 days supply | Qty: 30 | Fill #4

## 2017-02-19 MED FILL — LISINOPRIL 10 MG TABS: 10 | 30 days supply | Qty: 30 | Fill #5

## 2017-03-02 ENCOUNTER — Ambulatory Visit: Payer: Medicare Other | Attending: Internal Medicine | Admitting: Internal Medicine

## 2017-03-02 ENCOUNTER — Encounter: Payer: Self-pay | Admitting: Internal Medicine

## 2017-03-02 VITALS — BP 155/80 | HR 58 | Temp 97.8°F | Resp 18 | Ht 67.0 in | Wt 178.4 lb

## 2017-03-02 DIAGNOSIS — Z833 Family history of diabetes mellitus: Secondary | ICD-10-CM | POA: Insufficient documentation

## 2017-03-02 DIAGNOSIS — Z79899 Other long term (current) drug therapy: Secondary | ICD-10-CM | POA: Diagnosis not present

## 2017-03-02 DIAGNOSIS — Z9889 Other specified postprocedural states: Secondary | ICD-10-CM | POA: Diagnosis not present

## 2017-03-02 DIAGNOSIS — I10 Essential (primary) hypertension: Secondary | ICD-10-CM | POA: Diagnosis not present

## 2017-03-02 DIAGNOSIS — Z9189 Other specified personal risk factors, not elsewhere classified: Secondary | ICD-10-CM

## 2017-03-02 DIAGNOSIS — Z8249 Family history of ischemic heart disease and other diseases of the circulatory system: Secondary | ICD-10-CM | POA: Insufficient documentation

## 2017-03-02 DIAGNOSIS — R7303 Prediabetes: Secondary | ICD-10-CM | POA: Diagnosis not present

## 2017-03-02 DIAGNOSIS — D126 Benign neoplasm of colon, unspecified: Secondary | ICD-10-CM | POA: Insufficient documentation

## 2017-03-02 MED ORDER — LISINOPRIL 10 MG PO TABS: 15.0000 mg | ORAL_TABLET | Freq: Every day | ORAL | 6 refills | Status: DC

## 2017-03-02 NOTE — Progress Notes (Signed)
Patient ID: Benjamin Nunez, male    DOB: 06/14/1950  MRN: [id number]  CC: Follow-up (Patient is here for a check-up. )   Subjective: Benjamin Nunez is a 66 y.o. male who presents for chronic ds management.  Last seen 10/2016 His concerns today include:  Pre-DM, HTTN  1. Had colonoscopy 11/2016. 3 adenomatous polys removed. Needs repeat in 3 yrs.  2. HTN: compliant with Lisinopril and salt restriction.  "I try to watch my diet." -he walks for 1 hr 4 x a wk -no CP/SOB/LE/HA  3. Needs dentures. Would like referral to [facility]  Patient Active Problem List   Diagnosis Date Noted  . Prediabetes 11/21/2016  . Essential hypertension 09/18/2016     Current Outpatient Medications on File Prior to Visit  Medication Sig Dispense Refill  . diclofenac sodium (VOLTAREN) 1 % GEL Apply 2 g topically 4 (four) times daily.    Marland Kitchen lisinopril (PRINIVIL,ZESTRIL) 10 MG tablet Take 10 mg by mouth daily.    . clindamycin (CLEOCIN) 300 MG capsule Take 300 mg by mouth 3 (three) times daily.     Current Facility-Administered Medications on File Prior to Visit  Medication Dose Route Frequency Provider Last Rate Last Dose  . 0.9 %  sodium chloride infusion  500 mL Intravenous Continuous Armbruster, Carlota Raspberry, MD        No Known Allergies  Social History   Socioeconomic History  . Marital status: Single    Spouse name: Not on file  . Number of children: Not on file  . Years of education: Not on file  . Highest education level: Not on file  Social Needs  . Financial resource strain: Not on file  . Food insecurity - worry: Not on file  . Food insecurity - inability: Not on file  . Transportation needs - medical: Not on file  . Transportation needs - non-medical: Not on file  Occupational History  . Not on file  Tobacco Use  . Smoking status: Never Smoker  . Smokeless tobacco: Never Used  Substance and Sexual Activity  . Alcohol use: Yes    Comment: ocasionally  .  Drug use: No  . Sexual activity: Not on file  Other Topics Concern  . Not on file  Social History Narrative  . Not on file    Family History  Problem Relation Age of Onset  . Diabetes Brother   . Hypertension Brother   . Asthma Neg Hx   . Cancer Neg Hx   . Heart failure Neg Hx   . Hyperlipidemia Neg Hx   . Colon cancer Neg Hx   . Colon polyps Neg Hx   . Esophageal cancer Neg Hx   . Stomach cancer Neg Hx   . Rectal cancer Neg Hx     Past Surgical History:  Procedure Laterality Date  . ORIF FEMUR FRACTURE  1970s    ROS: Review of Systems  Genitourinary: Negative for difficulty urinating, dysuria and hematuria.  Neurological: Negative for dizziness, seizures and syncope.  Psychiatric/Behavioral:       Had pos depression screen but denies feeling down or depress    PHYSICAL EXAM: BP (!) 155/80 (BP Location: Left Arm, Patient Position: Sitting, Cuff Size: Normal)   Pulse (!) 58   Temp 97.8 F (36.6 C) (Oral)   Resp 18   Ht 5\' 7"  (1.702 m)   Wt 178 lb 6.4 oz (80.9 kg)   SpO2 99%   BMI 27.94 kg/m  BP 144/80 Physical Exam  General appearance - alert, well appearing, older African-American male and in no distress Mental status - alert, oriented to person, place, and time, normal mood, behavior, speech, dress, motor activity, and thought processes Mouth - mucous membranes moist, pharynx normal without lesions Neck - supple, no significant adenopathy Chest - clear to auscultation, no wheezes, rales or rhonchi, symmetric air entry Heart - normal rate, regular rhythm, normal S1, S2, no murmurs, rubs, clicks or gallops Extremities - peripheral pulses normal, no pedal edema, no clubbing or cyanosis   Chemistry      Component Value Date/Time   NA 146 (H) 09/18/2016 1631   K 3.9 09/18/2016 1631   CL 107 (H) 09/18/2016 1631   CO2 24 09/18/2016 1631   BUN 9 09/18/2016 1631   CREATININE 0.96 09/18/2016 1631      Component Value Date/Time   CALCIUM 9.6 09/18/2016 1631     ALKPHOS 57 09/18/2016 1631   AST 12 09/18/2016 1631   ALT 12 09/18/2016 1631   BILITOT 0.4 09/18/2016 1631     Lab Results  Component Value Date   WBC 4.4 09/18/2016   HGB 14.2 09/18/2016   HCT 42.4 09/18/2016   MCV 82 09/18/2016   PLT 166 09/18/2016   ASSESSMENT AND PLAN: 1. Essential hypertension Close to goal.  Increase lisinopril to 15 mg daily. Encourage patient to check blood pressure once a week with goal of being 140/90 or lower.  2. Adenomatous polyp of colon, unspecified part of colon  3.  Need for dental care. Dental referral submitted   Patient was given the opportunity to ask questions.  Patient verbalized understanding of the plan and was able to repeat key elements of the plan.   No orders of the defined types were placed in this encounter.    Requested Prescriptions    No prescriptions requested or ordered in this encounter    No Follow-up on file.  Karle Plumber, MD, FACP

## 2017-03-02 NOTE — Patient Instructions (Signed)
Increase Lisinopril to 10 mg  1 1/2 tablets daily.  Check your blood pressure about once a week. Goal is to be 140/90 or lower.

## 2017-03-23 MED FILL — LISINOPRIL 10 MG TABS: 10 | 30 days supply | Qty: 30 | Fill #6

## 2017-04-25 MED FILL — LISINOPRIL 10 MG TABS: 10 | 30 days supply | Qty: 30 | Fill #7

## 2017-05-22 MED FILL — LISINOPRIL 10 MG TABS: 10 | 30 days supply | Qty: 30 | Fill #8

## 2017-06-26 MED FILL — LISINOPRIL 10 MG TABS: 10 | 30 days supply | Qty: 30 | Fill #9

## 2017-07-24 MED FILL — LISINOPRIL 10 MG TABS: 10 | 30 days supply | Qty: 30 | Fill #10

## 2017-07-31 ENCOUNTER — Encounter: Payer: Self-pay | Admitting: Internal Medicine

## 2017-07-31 ENCOUNTER — Encounter

## 2017-07-31 ENCOUNTER — Ambulatory Visit: Payer: Medicare Other | Attending: Internal Medicine | Admitting: Internal Medicine

## 2017-07-31 VITALS — BP 132/72 | HR 71 | Temp 97.9°F | Resp 16 | Wt 181.4 lb

## 2017-07-31 DIAGNOSIS — Z114 Encounter for screening for human immunodeficiency virus [HIV]: Secondary | ICD-10-CM

## 2017-07-31 DIAGNOSIS — D126 Benign neoplasm of colon, unspecified: Secondary | ICD-10-CM | POA: Diagnosis not present

## 2017-07-31 DIAGNOSIS — R7303 Prediabetes: Secondary | ICD-10-CM | POA: Insufficient documentation

## 2017-07-31 DIAGNOSIS — B356 Tinea cruris: Secondary | ICD-10-CM

## 2017-07-31 DIAGNOSIS — Z1159 Encounter for screening for other viral diseases: Secondary | ICD-10-CM | POA: Diagnosis not present

## 2017-07-31 DIAGNOSIS — M175 Other unilateral secondary osteoarthritis of knee: Secondary | ICD-10-CM | POA: Diagnosis not present

## 2017-07-31 DIAGNOSIS — I10 Essential (primary) hypertension: Secondary | ICD-10-CM

## 2017-07-31 MED ORDER — DICLOFENAC SODIUM 1 % TD GEL: 2.0000 g | Freq: Four times a day (QID) | TRANSDERMAL | 1 refills | Status: DC

## 2017-07-31 MED ORDER — LISINOPRIL 10 MG PO TABS: 15.0000 mg | ORAL_TABLET | Freq: Every day | ORAL | 6 refills | Status: DC

## 2017-07-31 MED ORDER — MUPIROCIN CALCIUM 2 % EX CREA: 1.0000 "application " | TOPICAL_CREAM | Freq: Two times a day (BID) | CUTANEOUS | 0 refills | Status: DC

## 2017-07-31 MED ORDER — KETOCONAZOLE 2 % EX CREA: TOPICAL_CREAM | CUTANEOUS | 0 refills | Status: DC

## 2017-07-31 MED FILL — KETOCONAZOLE 2% CREAM: 2 | 7 days supply | Qty: 15 | Fill #0

## 2017-07-31 MED FILL — DICLOFENAC SODIUM 1% GEL: 1 | 12 days supply | Qty: 100 | Fill #0

## 2017-07-31 NOTE — Progress Notes (Signed)
Patient ID: Benjamin Nunez, male    DOB: 08-20-1950  MRN: [id number]  CC: Hypertension   Subjective: Benjamin Nunez is a 67 y.o. male who presents for chronic ds management. His concerns today include:  Pre-DM, HTTN, adenomatous colon polyps  C/o intermittent stiffness, pain and swelling in LT knee x 1 yr.  Would like a cream to use on it. Used Voltaren gel last yr and found it helpful.  C/o Itching upper inner thighs especially when he first gets out of shower.  Uses same soap for yrs.  Scented soaps cause him to breakout so he avoids using them  HTN:  No device to check BP Compliant with Lisinopril.  Limits salt in foods. No CP/SOB/LE edema Walks in his neighborhood a few times a wk  Patient Active Problem List   Diagnosis Date Noted  . Adenomatous polyp of colon 03/02/2017  . Prediabetes 11/21/2016  . Essential hypertension 09/18/2016     Current Outpatient Medications on File Prior to Visit  Medication Sig Dispense Refill  . clindamycin (CLEOCIN) 300 MG capsule Take 300 mg by mouth 3 (three) times daily.     Current Facility-Administered Medications on File Prior to Visit  Medication Dose Route Frequency Provider Last Rate Last Dose  . 0.9 %  sodium chloride infusion  500 mL Intravenous Continuous Armbruster, Carlota Raspberry, MD        No Known Allergies  Social History   Socioeconomic History  . Marital status: Single    Spouse name: Not on file  . Number of children: Not on file  . Years of education: Not on file  . Highest education level: Not on file  Occupational History  . Not on file  Social Needs  . Financial resource strain: Not on file  . Food insecurity:    Worry: Not on file    Inability: Not on file  . Transportation needs:    Medical: Not on file    Non-medical: Not on file  Tobacco Use  . Smoking status: Never Smoker  . Smokeless tobacco: Never Used  Substance and Sexual Activity  . Alcohol use: Yes    Comment: ocasionally  . Drug use:  No  . Sexual activity: Not on file  Lifestyle  . Physical activity:    Days per week: Not on file    Minutes per session: Not on file  . Stress: Not on file  Relationships  . Social connections:    Talks on phone: Not on file    Gets together: Not on file    Attends religious service: Not on file    Active member of club or organization: Not on file    Attends meetings of clubs or organizations: Not on file    Relationship status: Not on file  . Intimate partner violence:    Fear of current or ex partner: Not on file    Emotionally abused: Not on file    Physically abused: Not on file    Forced sexual activity: Not on file  Other Topics Concern  . Not on file  Social History Narrative  . Not on file    Family History  Problem Relation Age of Onset  . Diabetes Brother   . Hypertension Brother   . Asthma Neg Hx   . Cancer Neg Hx   . Heart failure Neg Hx   . Hyperlipidemia Neg Hx   . Colon cancer Neg Hx   . Colon polyps Neg Hx   .  Esophageal cancer Neg Hx   . Stomach cancer Neg Hx   . Rectal cancer Neg Hx     Past Surgical History:  Procedure Laterality Date  . ORIF FEMUR FRACTURE  1970s    ROS: Review of Systems Negative except as stated above PHYSICAL EXAM: BP 132/72   Pulse 71   Temp 97.9 F (36.6 C) (Oral)   Resp 16   Wt 181 lb 6.4 oz (82.3 kg)   SpO2 96%   BMI 28.41 kg/m   Wt Readings from Last 3 Encounters:  07/31/17 181 lb 6.4 oz (82.3 kg)  03/02/17 178 lb 6.4 oz (80.9 kg)  11/22/16 174 lb (78.9 kg)    Physical Exam  General appearance - alert, well appearing, and in no distress Mental status - alert, oriented to person, place, and time, normal mood, behavior, speech, dress, motor activity, and thought processes Neck - supple, no significant adenopathy Chest - clear to auscultation, no wheezes, rales or rhonchi, symmetric air entry Heart - normal rate, regular rhythm, normal S1, S2, no murmurs, rubs, clicks or gallops Musculoskeletal -left  knee: Slight joint enlargement.  No point tenderness.  Good passive range of motion. Extremities - peripheral pulses normal, no pedal edema, no clubbing or cyanosis Skin: Hyperpigmented dry, scaly areas of the upper inner thigh bilaterally  ASSESSMENT AND PLAN: 1. Essential hypertension At goal.  Continue lisinopril. - CBC - Comprehensive metabolic panel - lisinopril (PRINIVIL,ZESTRIL) 10 MG tablet; Take 1.5 tablets (15 mg total) by mouth daily.  2. Tinea cruris - ketoconazole (NIZORAL) 2 % cream; Apply to affected area in groin BID  3. Other secondary osteoarthritis of left knee - diclofenac sodium (VOLTAREN) 1 % GEL; Apply 2 g topically 4 (four) times daily.  4. Screening for HIV (human immunodeficiency virus) - HIV antibody  5. Need for hepatitis C screening test - Hepatitis C Antibody  Other orders - mupirocin cream (BACTROBAN) 2 %; Apply 1 application topically 2 (two) times daily. Use as needed for skin infections  Patient was given the opportunity to ask questions.  Patient verbalized understanding of the plan and was able to repeat key elements of the plan.   Orders Placed This Encounter  Procedures  . CBC  . Comprehensive metabolic panel  . HIV antibody  . Hepatitis C Antibody     Requested Prescriptions   Signed Prescriptions Disp Refills  . lisinopril (PRINIVIL,ZESTRIL) 10 MG tablet 45 tablet 6    Sig: Take 1.5 tablets (15 mg total) by mouth daily.  . diclofenac sodium (VOLTAREN) 1 % GEL 100 g 1    Sig: Apply 2 g topically 4 (four) times daily.  Marland Kitchen ketoconazole (NIZORAL) 2 % cream 15 g 0    Sig: Apply to affected area in groin BID  . mupirocin cream (BACTROBAN) 2 % 30 g 0    Sig: Apply 1 application topically 2 (two) times daily. Use as needed for skin infections    Return in about 4 months (around 12/01/2017).  Karle Plumber, MD, FACP

## 2017-08-01 LAB — COMPREHENSIVE METABOLIC PANEL
ALT: 14 IU/L (ref 0–44)
AST: 15 IU/L (ref 0–40)
Albumin/Globulin Ratio: 1.5 (ref 1.2–2.2)
Albumin: 4.4 g/dL (ref 3.6–4.8)
Alkaline Phosphatase: 58 IU/L (ref 39–117)
BUN/Creatinine Ratio: 9 — ABNORMAL LOW (ref 10–24)
BUN: 8 mg/dL (ref 8–27)
Bilirubin Total: 0.5 mg/dL (ref 0.0–1.2)
CALCIUM: 9.4 mg/dL (ref 8.6–10.2)
CO2: 23 mmol/L (ref 20–29)
CREATININE: 0.92 mg/dL (ref 0.76–1.27)
Chloride: 104 mmol/L (ref 96–106)
GFR, EST AFRICAN AMERICAN: 101 mL/min/{1.73_m2} (ref >59)
GFR, EST NON AFRICAN AMERICAN: 87 mL/min/{1.73_m2} (ref >59)
GLUCOSE: 87 mg/dL (ref 65–99)
Globulin, Total: 3 g/dL (ref 1.5–4.5)
Potassium: 4.2 mmol/L (ref 3.5–5.2)
Sodium: 141 mmol/L (ref 134–144)
TOTAL PROTEIN: 7.4 g/dL (ref 6.0–8.5)

## 2017-08-01 LAB — HIV ANTIBODY (ROUTINE TESTING W REFLEX): HIV Screen 4th Generation wRfx: NONREACTIVE

## 2017-08-01 LAB — CBC
Hematocrit: 40.2 % (ref 37.5–51.0)
Hemoglobin: 13.7 g/dL (ref 13.0–17.7)
MCH: 28.5 pg (ref 26.6–33.0)
MCHC: 34.1 g/dL (ref 31.5–35.7)
MCV: 84 fL (ref 79–97)
PLATELETS: 189 10*3/uL (ref 150–379)
RBC: 4.8 x10E6/uL (ref 4.14–5.80)
RDW: 15.3 % (ref 12.3–15.4)
WBC: 3.8 10*3/uL (ref 3.4–10.8)

## 2017-08-01 LAB — HEPATITIS C ANTIBODY: HEP C VIRUS AB: 0.3 {s_co_ratio} (ref 0.0–0.9)

## 2017-08-03 ENCOUNTER — Telehealth: Payer: Self-pay

## 2017-08-03 NOTE — Telephone Encounter (Signed)
Contacted pt to go over lab results pt didn't answer left a detailed vm informing pt of results and if he has any questions or concerns to give me a call  If pt calls back please give results: blood count is normal meaning no anemia. Kidney and liver function tests are normal. HIV and hepatitis screening tests were negative.

## 2017-08-09 ENCOUNTER — Other Ambulatory Visit: Payer: Self-pay

## 2017-08-09 MED ORDER — MUPIROCIN 2 % EX OINT: TOPICAL_OINTMENT | CUTANEOUS | 0 refills | Status: DC

## 2017-08-21 MED FILL — LISINOPRIL 10 MG TABS: 10 | 30 days supply | Qty: 45 | Fill #0

## 2017-08-27 ENCOUNTER — Ambulatory Visit: Payer: Medicare Other | Admitting: Internal Medicine

## 2017-09-20 MED FILL — LISINOPRIL 10 MG TABS: 10 | 30 days supply | Qty: 45 | Fill #1

## 2017-10-16 ENCOUNTER — Telehealth: Payer: Self-pay

## 2017-10-16 NOTE — Telephone Encounter (Signed)
CALLED TO INVITE PATIENT TO SCHEDULE THEIR MEDICARE ANNUAL WELLNESS VISIT WITH [place] AT [facility], PT ASKED IF DR. Wynetta Emery WAS DOING IT I TOLD HIM IT IS OUR CPP, [facility], BUT DR Wynetta Emery WAS STILL GOING TO BE HIS PCP, PT WAS AGREEABLE AND APPT WAS SCHEDULED

## 2017-10-23 MED FILL — LISINOPRIL 10 MG TABS: 10 | 30 days supply | Qty: 45 | Fill #2

## 2017-10-24 ENCOUNTER — Ambulatory Visit: Payer: Medicare Other | Attending: Family Medicine | Admitting: Pharmacist

## 2017-10-24 ENCOUNTER — Encounter: Payer: Self-pay | Admitting: Pharmacist

## 2017-10-24 VITALS — BP 115/72 | HR 65 | Temp 97.8°F | Ht 66.0 in | Wt 173.6 lb

## 2017-10-24 DIAGNOSIS — Z0000 Encounter for general adult medical examination without abnormal findings: Secondary | ICD-10-CM

## 2017-10-24 NOTE — Patient Instructions (Signed)
Thank you for coming to see me today.   We saw you for your Annual Wellness Visit today. This was basically a check-up that helps [place] keep your medical information updated.   I will forward this information to your primary care doctor, Dr. Wynetta Emery. She will look this over and follow-up with you concerning any additional steps to help increase your wellness. These steps could include setting new goals, getting certain vaccines, receiving appropriate screenings, or making steps to address concerns identified during this wellness visit.   Things that you should discuss with your doctor at your next visit include:  1. Colonoscopy for cancer screening in 2021 2. Receiving different vaccines (Tetanus, pneumonia, and shingles)  If you have any questions about medications, how to use them, or medication side effects please let me know. I can schedule you an appointment to help you manage your medication list.   If anything happens at home and you need to see your doctor, please call the clinic.

## 2017-10-24 NOTE — Progress Notes (Signed)
Subjective:   Benjamin Nunez is a 67 y.o. male who presents for Medicare Annual/Subsequent preventive examination.  PMH/Active Problem List:  Cardiovascular and Mediastinum - Essential hypertension - Cardiac Risk Factors include: advanced age (>57men, >3 women), hypertension, male gender Digestive - Adenomatous polyp of colon Other - Prediabetes  Current Medical Providers and Suppliers:  Ladell Pier, MD as PCP - General (Internal Medicine)    No other providers or suppliers listed in [facility]. Patient does not report seeing any other specialist. He does mention that he needs to see his eye doctor. He was unable to provide the name of that doctor.   Advanced Directive Summary:  Advanced Directives 10/24/2017 11/08/2016 09/18/2016  Does Patient Have a Medical Advance Directive? No No No  Would patient like information on creating a medical advance directive? No - Patient declined - No - Patient declined    Objective:   Vitals: BP 115/72   Pulse 65   Temp 97.8 F (36.6 C)   Ht 5\' 6"  (1.676 m)   Wt 173 lb 9.6 oz (78.7 kg)   BMI 28.02 kg/m   Body mass index is 28.02 kg/m.   Social/Medical/Family History: Taken during encounter. Updates recorded in [facility] and listed below.   Social History   Tobacco Use  Smoking Status Never Smoker  Smokeless Tobacco Never Used    Counseling given: Not Answered  Denies illicit drug use Reports occasional intake of alcohol  Past Medical History:  Diagnosis Date  . Diabetes mellitus without complication ([place])    not on any meds   . Hypertension   . Sickle cell trait [facility])    Past Surgical History:  Procedure Laterality Date  . ORIF FEMUR FRACTURE  1970s   Family History  Problem Relation Age of Onset  . Diabetes Brother   . Hypertension Brother   . Asthma Neg Hx   . Cancer Neg Hx   . Heart failure Neg Hx   . Hyperlipidemia Neg Hx   . Colon cancer Neg Hx   . Colon polyps Neg Hx   . Esophageal cancer Neg Hx   .  Stomach cancer Neg Hx   . Rectal cancer Neg Hx    Social History   Socioeconomic History  . Marital status: Married    Spouse name: Not on file  . Number of children: Not on file  . Years of education: Not on file  . Highest education level: 9th grade  Occupational History  . Not on file  Social Needs  . Financial resource strain: Not very hard  . Food insecurity:    Worry: Sometimes true    Inability: Sometimes true  . Transportation needs:    Medical: No    Non-medical: No  Tobacco Use  . Smoking status: Never Smoker  . Smokeless tobacco: Never Used  Substance and Sexual Activity  . Alcohol use: Yes    Comment: ocasionally  . Drug use: No  . Sexual activity: Not on file  Lifestyle  . Physical activity:    Days per week: 4 days    Minutes per session: 30 min  . Stress: Not at all  Relationships  . Social connections:    Talks on phone: More than three times a week    Gets together: More than three times a week    Attends religious service: More than 4 times per year    Active member of club or organization: Yes    Attends meetings of clubs  or organizations: More than 4 times per year    Relationship status: Married  Other Topics Concern  . Not on file  Social History Narrative  . Not on file   Outpatient Encounter Medications as of 10/24/2017  Medication Sig  . diclofenac sodium (VOLTAREN) 1 % GEL Apply 2 g topically 4 (four) times daily.  Marland Kitchen lisinopril (PRINIVIL,ZESTRIL) 10 MG tablet Take 1.5 tablets (15 mg total) by mouth daily.  Marland Kitchen ketoconazole (NIZORAL) 2 % cream Apply to affected area in groin BID (Patient not taking: Reported on 10/24/2017)  . mupirocin cream (BACTROBAN) 2 % Apply 1 application topically 2 (two) times daily. Use as needed for skin infections (Patient not taking: Reported on 10/24/2017)  . [DISCONTINUED] clindamycin (CLEOCIN) 300 MG capsule Take 300 mg by mouth 3 (three) times daily.  . [DISCONTINUED] mupirocin ointment (BACTROBAN) 2 % Apply 1  application topically 2 times daily. Use as needed for skin infections   Facility-Administered Encounter Medications as of 10/24/2017  Medication  . 0.9 %  sodium chloride infusion   Clinical Intake: Pre-visit preparation completed: Yes   Pain : No/denies pain Pain Score: 0-No pain  BMI - recorded: 28.02 Nutritional Status: BMI 25 -29 Overweight Nutritional Risks: None Diabetes: No  How often do you need to have someone help you when you read instructions, pamphlets, or other written materials from your doctor or pharmacy?: 2 - Rarely. Pt states that his wife sometimes accompanies him to visits and helps him read. Of note, on his MMSE he was unable to spell "WORLD" but was able to read a simple sentence.  What is the last grade level you completed in school?: 9th - Did not obtain GED.  Interpreter Needed?: No  Functional Ability/Safety Screening/Health Risk Assessment  Activities of Daily Living In your present state of health, do you have any difficulty performing the following activities: 10/24/2017  Hearing? N  Vision? N  Difficulty concentrating or making decisions? N  Walking or climbing stairs? N  Dressing or bathing? N  Doing errands, shopping? N  Preparing Food and eating ? N  Using the Toilet? N  In the past six months, have you accidently leaked urine? N  Do you have problems with loss of bowel control? N  Managing your Medications? N  Managing your Finances? N  Housekeeping or managing your Housekeeping? N  Some recent data might be hidden   Fall Risk Fall Risk  10/24/2017  Falls in the past year? No   Is the patient's home free of loose throw rugs in walkways, pet beds, electrical cords, etc?   yes      Grab bars in the bathroom? no      Handrails on the stairs?   no      Adequate lighting?   yes  Depression Screen PHQ 2/9 Scores 10/24/2017 07/31/2017 03/02/2017 09/18/2016  PHQ - 2 Score 0 0 0 2  PHQ- 9 Score - - 4 10   Cognitive Function MMSE - Mini Mental  State Exam 10/24/2017  Orientation to time 4  Orientation to time comments Patient misidentified year - Gave 2020  Orientation to Place 5  Registration 3  Attention/ Calculation 3  Attention/Calculation-comments Was able to count backwards from 100 by 5s but not by 7. Additionally, not able to spelll "WORLD" forwards or backwards.  Recall 0  Recall-comments Immediate recall intact. Delayed recall 0/3.  Language- name 2 objects 2  Language- repeat 1  Language- follow 3 step command 3  Language-  read & follow direction 1  Write a sentence 0  Copy design 1  Total score 23   Age-Appropriate Screening and Vaccination Schedule:  Immunizations:  There is no immunization history on file for this patient. Due dates listed below.  Qualifies for Shingles Vaccine? yes  Screenings: CRC: colonoscopy performed 11/22/16  Hep C: completed 07/31/17 A1c: last completed 09/18/16 Ophthalmology exam: not documented in [facility]   Upcoming Due Dates: Health Maintenance  Topic Date Due  . FOOT EXAM  10/12/1961  . OPHTHALMOLOGY EXAM  10/12/1961  . HEMOGLOBIN A1C  03/20/2017  . INFLUENZA VACCINE  12/22/2017 (Originally 10/25/2017)  . TETANUS/TDAP  08/01/2018 (Originally 10/13/1970)  . PNA vac Low Risk Adult (1 of 2 - PCV13) 12/21/2018 (Originally 10/12/2016)  . COLONOSCOPY  11/23/2019  . Hepatitis C Screening  Completed     Assessment:   This is a routine wellness examination for Benjamin Nunez.   HRA/Patient Safety Patient with no fall history. No fall risk identified. Depression screening negative with 0/2 PHQ-2 score. No additional depressive symptoms identified. MMSE 23/30. Of note, immediate recall 3/3 but delayed recall 0/3.  Additionally, patient was unable to spell "WORLD". He was able to read and follow instructions but was unable to complete a written sentence. I have forwarded this information to PCP. Of note, I am not sure if his MMSE score was influenced by his education level. I have made his provider  aware.   Social/Medical/Family History Updated histories in [facility]. Patient denies ever smoking. Reports being socially active/engaged with family.  Exercise Activities and Dietary Recommendations BMI of 28.02. Counseling given concerning healthy diet and 150 mins/week of moderate exercise.  Current Exercise Habits: Pt uses home exercise routine. Walks every other day for 30-40 minutes. States that his exercise is not limited.   Functional Ability Patient independent with ADLs. Denies visual or hearing impairment. Denies urinary/fecal incontinence. He does wear glasses. States that he needs to see his ophthalmologist. When I asked the name of this doctor, pt could not recall.    Medication Review Medication regimen reviewed with patient. Reports compliance with daily/scheduled medications. He does not take aspirin or cholesterol-lowering medication.  No clinical ASCVD. Last LDL 73. No diabetes or other high-risk conditions with the exception of high blood pressure. No indication for statins or ASA at this time.   Screening/Vaccines  Patient is due PNA, shingles, and tetanus vaccines. When I offered these, pt declined. Vaccines have been postponed in HM section of [facility]. Additionally, he is due for an eye exam and A1c screening. He is up-to-date on his colonoscopy screening. Will forward information to his PCP.   Plan:  Mr. Popoff was seen today for his AWV. Pertinent information will be forwarded to PCP.   I have personally reviewed and noted the following in the patient's chart:   . Medical and social history . Use of alcohol, tobacco or illicit drugs  . Current medications and supplements . Functional ability and status . Nutritional status . Physical activity . Advanced directives . List of other physicians . Hospitalizations, surgeries, and ER visits in previous 12 months . Vitals . Screenings to include cognitive, depression, and falls . Referrals and appointments  In  addition, I have reviewed and discussed with patient certain preventive protocols, quality metrics, and best practice recommendations. A written personalized care plan for preventive services as well as general preventive health recommendations were provided to patient.    Avenue B and C, RPH-CPP  10/24/2017

## 2017-11-20 MED FILL — LISINOPRIL 10 MG TABS: 10 | 30 days supply | Qty: 45 | Fill #3

## 2017-12-03 ENCOUNTER — Ambulatory Visit: Payer: Medicare Other | Attending: Internal Medicine | Admitting: Internal Medicine

## 2017-12-03 ENCOUNTER — Encounter: Payer: Self-pay | Admitting: Internal Medicine

## 2017-12-03 VITALS — BP 130/82 | HR 65 | Temp 97.9°F | Resp 16 | Wt 179.8 lb

## 2017-12-03 DIAGNOSIS — D126 Benign neoplasm of colon, unspecified: Secondary | ICD-10-CM | POA: Insufficient documentation

## 2017-12-03 DIAGNOSIS — R7303 Prediabetes: Secondary | ICD-10-CM | POA: Insufficient documentation

## 2017-12-03 DIAGNOSIS — Z79899 Other long term (current) drug therapy: Secondary | ICD-10-CM | POA: Insufficient documentation

## 2017-12-03 DIAGNOSIS — Z2821 Immunization not carried out because of patient refusal: Secondary | ICD-10-CM | POA: Diagnosis not present

## 2017-12-03 DIAGNOSIS — I10 Essential (primary) hypertension: Secondary | ICD-10-CM | POA: Diagnosis not present

## 2017-12-03 DIAGNOSIS — Z8249 Family history of ischemic heart disease and other diseases of the circulatory system: Secondary | ICD-10-CM | POA: Insufficient documentation

## 2017-12-03 DIAGNOSIS — Z833 Family history of diabetes mellitus: Secondary | ICD-10-CM | POA: Insufficient documentation

## 2017-12-03 NOTE — Patient Instructions (Signed)
If you do get a blood pressure monitoring device, check your blood pressure at least twice a week and bring any readings on follow-up visit.

## 2017-12-03 NOTE — Progress Notes (Signed)
Patient ID: Benjamin Nunez, male    DOB: [birth year]  MRN: [id number]  CC: Hypertension  Subjective: Benjamin Nunez is a 67 y.o. male who presents for chronic disease management. His concerns today include:  Pre-DM, HTTN, adenomatous colon polyps  Patient had Medicare wellness visit with our clinical pharmacist in July.  His MMSE 23/30.  He has no complaints about his memory and has not received any concerns from family members.  No family history of dementia.  HTN:  Compliant with Lisinopril and salt restriction No device to check blood pressure No CP/SOB/LE edema.  Walks several blocks QOD about 1.5 miles Patient Active Problem List   Diagnosis Date Noted  . Adenomatous polyp of colon 03/02/2017  . Prediabetes 11/21/2016  . Essential hypertension 09/18/2016     Current Outpatient Medications on File Prior to Visit  Medication Sig Dispense Refill  . diclofenac sodium (VOLTAREN) 1 % GEL Apply 2 g topically 4 (four) times daily. 100 g 1  . lisinopril (PRINIVIL,ZESTRIL) 10 MG tablet Take 1.5 tablets (15 mg total) by mouth daily. 45 tablet 6   No current facility-administered medications on file prior to visit.     No Known Allergies  Social History   Socioeconomic History  . Marital status: Married    Spouse name: Not on file  . Number of children: Not on file  . Years of education: Not on file  . Highest education level: 9th grade  Occupational History  . Not on file  Social Needs  . Financial resource strain: Not very hard  . Food insecurity:    Worry: Sometimes true    Inability: Sometimes true  . Transportation needs:    Medical: No    Non-medical: No  Tobacco Use  . Smoking status: Never Smoker  . Smokeless tobacco: Never Used  Substance and Sexual Activity  . Alcohol use: Yes    Comment: ocasionally  . Drug use: No  . Sexual activity: Not on file  Lifestyle  . Physical activity:    Days per week: 4 days    Minutes per session: 30 min  . Stress:  Not at all  Relationships  . Social connections:    Talks on phone: More than three times a week    Gets together: More than three times a week    Attends religious service: More than 4 times per year    Active member of club or organization: Yes    Attends meetings of clubs or organizations: More than 4 times per year    Relationship status: Married  . Intimate partner violence:    Fear of current or ex partner: No    Emotionally abused: No    Physically abused: No    Forced sexual activity: No  Other Topics Concern  . Not on file  Social History Narrative  . Not on file    Family History  Problem Relation Age of Onset  . Diabetes Brother   . Hypertension Brother   . Asthma Neg Hx   . Cancer Neg Hx   . Heart failure Neg Hx   . Hyperlipidemia Neg Hx   . Colon cancer Neg Hx   . Colon polyps Neg Hx   . Esophageal cancer Neg Hx   . Stomach cancer Neg Hx   . Rectal cancer Neg Hx     Past Surgical History:  Procedure Laterality Date  . ORIF FEMUR FRACTURE  1970s    ROS: Review of Systems Negative except  as stated above PHYSICAL EXAM: BP 130/82   Pulse 65   Temp 97.9 F (36.6 C) (Oral)   Resp 16   Wt 179 lb 12.8 oz (81.6 kg)   SpO2 99%   BMI 29.02 kg/m   Repeat BP 130/82 Physical Exam General appearance - alert, well appearing, and in no distress Mental status - normal mood, behavior, speech, dress, motor activity, and thought processes Eyes - pupils equal and reactive, extraocular eye movements intact Neck - supple, no significant adenopathy Chest - clear to auscultation, no wheezes, rales or rhonchi, symmetric air entry Heart - normal rate, regular rhythm, normal S1, S2, no murmurs, rubs, clicks or gallops Extremities - peripheral pulses normal, no pedal edema, no clubbing or cyanosis  Results for orders placed or performed in visit on 07/31/17  CBC  Result Value Ref Range   WBC 3.8 3.4 - 10.8 x10E3/uL   RBC 4.80 4.14 - 5.80 x10E6/uL   Hemoglobin 13.7  13.0 - 17.7 g/dL   Hematocrit 40.2 37.5 - 51.0 %   MCV 84 79 - 97 fL   MCH 28.5 26.6 - 33.0 pg   MCHC 34.1 31.5 - 35.7 g/dL   RDW 15.3 12.3 - 15.4 %   Platelets 189 150 - 379 x10E3/uL  Comprehensive metabolic panel  Result Value Ref Range   Glucose 87 65 - 99 mg/dL   BUN 8 8 - 27 mg/dL   Creatinine, Ser 0.92 0.76 - 1.27 mg/dL   GFR calc non Af Amer 87 >59 mL/min/1.73   GFR calc Af Amer 101 >59 mL/min/1.73   BUN/Creatinine Ratio 9 (L) 10 - 24   Sodium 141 134 - 144 mmol/L   Potassium 4.2 3.5 - 5.2 mmol/L   Chloride 104 96 - 106 mmol/L   CO2 23 20 - 29 mmol/L   Calcium 9.4 8.6 - 10.2 mg/dL   Total Protein 7.4 6.0 - 8.5 g/dL   Albumin 4.4 3.6 - 4.8 g/dL   Globulin, Total 3.0 1.5 - 4.5 g/dL   Albumin/Globulin Ratio 1.5 1.2 - 2.2   Bilirubin Total 0.5 0.0 - 1.2 mg/dL   Alkaline Phosphatase 58 39 - 117 IU/L   AST 15 0 - 40 IU/L   ALT 14 0 - 44 IU/L  HIV antibody  Result Value Ref Range   HIV Screen 4th Generation wRfx Non Reactive Non Reactive  Hepatitis C Antibody  Result Value Ref Range   Hep C Virus Ab 0.3 0.0 - 0.9 s/co ratio    ASSESSMENT AND PLAN: 1. Essential hypertension At goal.  Continue lisinopril. And DASH diet.  2. Influenza vaccination declined  We will plan to repeat MMSE in about 6 months to a year.  Patient was given the opportunity to ask questions.  Patient verbalized understanding of the plan and was able to repeat key elements of the plan.   No orders of the defined types were placed in this encounter.    Requested Prescriptions    No prescriptions requested or ordered in this encounter    Return in about 4 months (around 04/04/2018).  Karle Plumber, MD, FACP

## 2017-12-04 ENCOUNTER — Telehealth: Payer: Self-pay | Admitting: Internal Medicine

## 2017-12-04 DIAGNOSIS — Z9189 Other specified personal risk factors, not elsewhere classified: Secondary | ICD-10-CM

## 2017-12-04 DIAGNOSIS — Z0100 Encounter for examination of eyes and vision without abnormal findings: Secondary | ICD-10-CM

## 2017-12-04 NOTE — Telephone Encounter (Signed)
-----   Message from Tresa Endo, [place] sent at 12/03/2017  4:24 PM EDT ----- Hello Dr. Wynetta Emery,   I believe you saw the above-specified patient today (12/03/17). I received a call from him this afternoon. He states that he forgot to mention wanting a referral to dental care and an ophthalmologist today during your encounter with him.   I told him I would make you aware.   Thank you for all you do,  Lurena Joiner

## 2017-12-20 MED FILL — LISINOPRIL 10 MG TABS: 10 | 30 days supply | Qty: 45 | Fill #4

## 2018-01-22 MED FILL — LISINOPRIL 10 MG TABS: 10 | 30 days supply | Qty: 45 | Fill #5

## 2018-02-07 ENCOUNTER — Telehealth: Payer: Self-pay | Admitting: Internal Medicine

## 2018-02-07 NOTE — Telephone Encounter (Signed)
Please call patient back about dental

## 2018-02-25 MED FILL — LISINOPRIL 10 MG TABS: 10 | 30 days supply | Qty: 45 | Fill #6

## 2018-04-02 ENCOUNTER — Encounter: Payer: Self-pay | Admitting: Internal Medicine

## 2018-04-02 ENCOUNTER — Ambulatory Visit: Payer: Medicare Other | Attending: Internal Medicine | Admitting: Internal Medicine

## 2018-04-02 VITALS — BP 151/82 | HR 75 | Temp 97.7°F | Resp 16 | Ht 66.0 in | Wt 184.6 lb

## 2018-04-02 DIAGNOSIS — Z833 Family history of diabetes mellitus: Secondary | ICD-10-CM | POA: Insufficient documentation

## 2018-04-02 DIAGNOSIS — Z79899 Other long term (current) drug therapy: Secondary | ICD-10-CM | POA: Insufficient documentation

## 2018-04-02 DIAGNOSIS — I10 Essential (primary) hypertension: Secondary | ICD-10-CM | POA: Insufficient documentation

## 2018-04-02 DIAGNOSIS — Z131 Encounter for screening for diabetes mellitus: Secondary | ICD-10-CM | POA: Diagnosis not present

## 2018-04-02 DIAGNOSIS — E663 Overweight: Secondary | ICD-10-CM | POA: Diagnosis not present

## 2018-04-02 DIAGNOSIS — Z6829 Body mass index (BMI) 29.0-29.9, adult: Secondary | ICD-10-CM | POA: Insufficient documentation

## 2018-04-02 DIAGNOSIS — R7303 Prediabetes: Secondary | ICD-10-CM | POA: Insufficient documentation

## 2018-04-02 DIAGNOSIS — Z8249 Family history of ischemic heart disease and other diseases of the circulatory system: Secondary | ICD-10-CM | POA: Insufficient documentation

## 2018-04-02 MED ORDER — LISINOPRIL 10 MG PO TABS: 15.0000 mg | ORAL_TABLET | Freq: Every day | ORAL | 6 refills | Status: DC

## 2018-04-02 MED ORDER — AMLODIPINE BESYLATE 5 MG PO TABS: 5.0000 mg | ORAL_TABLET | Freq: Every day | ORAL | 3 refills | Status: DC

## 2018-04-02 MED FILL — LISINOPRIL 10 MG TABS: 10 | 30 days supply | Qty: 45 | Fill #0

## 2018-04-02 MED FILL — AMLODIPINE BESYLATE 5 MG TA: 5 | 30 days supply | Qty: 30 | Fill #0

## 2018-04-02 NOTE — Patient Instructions (Signed)
Your blood pressure is not at goal of 130/80 or lower.  We have added another blood pressure medication called amlodipine 5 mg daily.  Continue to limit salt in the foods.  Try to get in some form of aerobic exercise at least 3 days a week for 30 minutes.  Follow a Healthy Eating Plan - You can do it! Limit sugary drinks.  Avoid sodas, sweet tea, sport or energy drinks, or fruit drinks.  Drink water, lo-fat milk, or diet drinks. Limit snack foods.   Cut back on candy, cake, cookies, chips, ice cream.  These are a special treat, only in small amounts. Eat plenty of vegetables.  Especially dark green, red, and orange vegetables. Aim for at least 3 servings a day. More is better! Include fruit in your daily diet.  Whole fruit is much healthier than fruit juice! Limit "white" bread, "white" pasta, "white" rice.   Choose "100% whole grain" products, brown or wild rice. Avoid fatty meats. Try "Meatless Monday" and choose eggs or beans one day a week.  When eating meat, choose lean meats like chicken, [place], and fish.  Grill, broil, or bake meats instead of frying, and eat poultry without the skin. Eat less salt.  Avoid frozen pizzas, frozen dinners and salty foods.  Use seasonings other than salt in cooking.  This can help blood pressure and keep you from swelling Beer, wine and liquor have calories.  If you can safely drink alcohol, limit to 1 drink per day for women, 2 drinks for men

## 2018-04-02 NOTE — Progress Notes (Signed)
Patient ID: Benjamin Nunez, male    DOB: 10-02-1950  MRN: [id number]  CC: Hypertension   Subjective: Benjamin Nunez is a 68 y.o. male who presents for chronic ds management His concerns today include:  , HTN, adenomatous colon polyps  Pt reported to me on 1st visit that he was borderline DM but BS and A1C were nl.  His brother is DM.  He would like to be screened for diabetes No polyuria/polydipsia or blurred vision Walks 2-3 x a wk.  Plans to join gym  HTN:  No CP/SOB/LE edema.  Reports compliance with lisinopril and low-salt diet.   Patient Active Problem List   Diagnosis Date Noted  . Adenomatous polyp of colon 03/02/2017  . Prediabetes 11/21/2016  . Essential hypertension 09/18/2016     Current Outpatient Medications on File Prior to Visit  Medication Sig Dispense Refill  . diclofenac sodium (VOLTAREN) 1 % GEL Apply 2 g topically 4 (four) times daily. (Patient not taking: Reported on 04/02/2018) 100 g 1   No current facility-administered medications on file prior to visit.     No Known Allergies  Social History   Socioeconomic History  . Marital status: Married    Spouse name: Not on file  . Number of children: Not on file  . Years of education: Not on file  . Highest education level: 9th grade  Occupational History  . Not on file  Social Needs  . Financial resource strain: Not very hard  . Food insecurity:    Worry: Sometimes true    Inability: Sometimes true  . Transportation needs:    Medical: No    Non-medical: No  Tobacco Use  . Smoking status: Never Smoker  . Smokeless tobacco: Never Used  Substance and Sexual Activity  . Alcohol use: Yes    Comment: ocasionally  . Drug use: No  . Sexual activity: Not on file  Lifestyle  . Physical activity:    Days per week: 4 days    Minutes per session: 30 min  . Stress: Not at all  Relationships  . Social connections:    Talks on phone: More than three times a week    Gets together: More than  three times a week    Attends religious service: More than 4 times per year    Active member of club or organization: Yes    Attends meetings of clubs or organizations: More than 4 times per year    Relationship status: Married  . Intimate partner violence:    Fear of current or ex partner: No    Emotionally abused: No    Physically abused: No    Forced sexual activity: No  Other Topics Concern  . Not on file  Social History Narrative  . Not on file    Family History  Problem Relation Age of Onset  . Diabetes Brother   . Hypertension Brother   . Asthma Neg Hx   . Cancer Neg Hx   . Heart failure Neg Hx   . Hyperlipidemia Neg Hx   . Colon cancer Neg Hx   . Colon polyps Neg Hx   . Esophageal cancer Neg Hx   . Stomach cancer Neg Hx   . Rectal cancer Neg Hx     Past Surgical History:  Procedure Laterality Date  . ORIF FEMUR FRACTURE  1970s    ROS: Review of Systems Negative except as above. PHYSICAL EXAM: BP (!) 151/82 (BP Location: Right Arm, Cuff Size:  Normal) Comment: recheck  Pulse 75   Temp 97.7 F (36.5 C) (Oral)   Resp 16   Ht 5\' 6"  (1.676 m)   Wt 184 lb 9.6 oz (83.7 kg)   SpO2 99%   BMI 29.80 kg/m   Wt Readings from Last 3 Encounters:  04/02/18 184 lb 9.6 oz (83.7 kg)  12/03/17 179 lb 12.8 oz (81.6 kg)  10/24/17 173 lb 9.6 oz (78.7 kg)   Repeat BP 148/88 Physical Exam  General appearance - alert, well appearing, and in no distress Mental status - normal mood, behavior, speech, dress, motor activity, and thought processes Neck - supple, no significant adenopathy Chest - clear to auscultation, no wheezes, rales or rhonchi, symmetric air entry Heart - normal rate, regular rhythm, normal S1, S2, no murmurs, rubs, clicks or gallops Extremities - peripheral pulses normal, no pedal edema, no clubbing or cyanosis   ASSESSMENT AND PLAN:  1. Essential hypertension Not at goal of 130/80 or lower.  We have added amlodipine 5 mg daily.  Continue  lisinopril. - lisinopril (PRINIVIL,ZESTRIL) 10 MG tablet; Take 1.5 tablets (15 mg total) by mouth daily.  Dispense: 45 tablet; Refill: 6  2. Diabetes mellitus screening 3.  Overweight We will screen for diabetes with A1c.  Discussed healthy eating habits and encouraged regular aerobic exercise at least about 150 minutes a week. - Hemoglobin A1c    Patient was given the opportunity to ask questions.  Patient verbalized understanding of the plan and was able to repeat key elements of the plan.   Orders Placed This Encounter  Procedures  . Hemoglobin A1c     Requested Prescriptions   Signed Prescriptions Disp Refills  . lisinopril (PRINIVIL,ZESTRIL) 10 MG tablet 45 tablet 6    Sig: Take 1.5 tablets (15 mg total) by mouth daily.  Marland Kitchen amLODipine (NORVASC) 5 MG tablet 90 tablet 3    Sig: Take 1 tablet (5 mg total) by mouth daily.    Return in about 4 months (around 08/01/2018).  Karle Plumber, MD, FACP

## 2018-04-03 LAB — HEMOGLOBIN A1C
Est. average glucose Bld gHb Est-mCnc: 108 mg/dL
HEMOGLOBIN A1C: 5.4 % (ref 4.8–5.6)

## 2018-04-16 ENCOUNTER — Telehealth: Payer: Self-pay

## 2018-04-16 NOTE — Telephone Encounter (Signed)
Contacted pt to go over lab results pt is aware and doesn't have any questions or concerns 

## 2018-04-24 ENCOUNTER — Encounter: Payer: Self-pay | Admitting: Pharmacist

## 2018-04-24 ENCOUNTER — Ambulatory Visit: Payer: Medicare Other | Attending: Family Medicine | Admitting: Pharmacist

## 2018-04-24 VITALS — BP 133/76

## 2018-04-24 DIAGNOSIS — I10 Essential (primary) hypertension: Secondary | ICD-10-CM

## 2018-04-24 NOTE — Patient Instructions (Signed)
Thank you for coming to see [place] today.   Blood pressure today is improved! Your goal is less than 130 on the top and less than 80 on the bottom.  Continue taking blood pressure medications as prescribed.   Limiting salt and caffeine, as well as exercising as able for at least 30 minutes for 5 days out of the week, can also help you lower your blood pressure.  Take your blood pressure at home if you are able. Please write down these numbers and bring them to your visits.  If you have any questions about medications, please call me [phone].  Lurena Joiner

## 2018-04-24 NOTE — Progress Notes (Signed)
   S:    PCP: Dr. Wynetta Emery   Patient arrives in good spirits. Presents to the clinic for hypertension management. Patient was referred by Dr. Wynetta Emery on 04/02/2018. BP 151/82 at that visit - amlodipine added.  Patient reports adherence with medications. Denies headache, chest pain, dizziness, shortness of breath, and BLE edema.  Current BP Medications include:  Amlodipine 5 mg, lisinopril 15 mg (1.5 tablets of the 10mg )  Antihypertensives tried in the past include: NKDA, no others tried   Dietary habits include: limits salt, does not consume caffeine Exercise habits include: walks 3x a week   Family / Social history: FHx significant for HTN in brother, never smoker, occasional use of alcohol  ASCVD risk factors include: hypertension, age, family hx of HTN  Home BP readings: reports SBPs in 130s  O:   BP in L arm after 5 minutes rest: 133/76  Last 3 Office BP readings: BP Readings from Last 3 Encounters:  04/24/18 133/76  04/02/18 (!) 151/82  12/03/17 130/82   BMET    Component Value Date/Time   NA 141 07/31/2017 1522   K 4.2 07/31/2017 1522   CL 104 07/31/2017 1522   CO2 23 07/31/2017 1522   GLUCOSE 87 07/31/2017 1522   BUN 8 07/31/2017 1522   CREATININE 0.92 07/31/2017 1522   CALCIUM 9.4 07/31/2017 1522   GFRNONAA 87 07/31/2017 1522   GFRAA 101 07/31/2017 1522   Renal function: CrCl cannot be calculated (Patient's most recent lab result is older than the maximum 21 days allowed.).  Clinical ASCVD: No  The ASCVD Risk score Mikey Bussing DC Jr., et al., 2013) failed to calculate for the following reasons:   The valid total cholesterol range is 130 to 320 mg/dL  A/P: Hypertension longstanding but close to goal on current medications. BP Goal <130/80 mmHg. Patient is adherent with current medications.   He was just started on amlodipine a couple of weeks ago. Reports improved pressures at home. With his reading today, will wait to adjust medications. He sees Dr. Wynetta Emery in  March.   -Continued current regimen.  -F/u labs ordered - lipid -Counseled on lifestyle modifications for blood pressure control including reduced dietary sodium, increased exercise, adequate sleep -HM: due for PNA and tetanus vaccines; provided education - pt declines today but will think about it for his next visit  Results reviewed and written information provided. Total time in face-to-face counseling 15 minutes.   F/U Clinic Visit in March with PCP.    Patient seen with:  Beckey Rutter, PharmD Candidate  [employer] [employer]  Class of 2022  Benard Halsted, PharmD, [place] [phone]

## 2018-04-25 LAB — LIPID PANEL
CHOL/HDL RATIO: 3.3 ratio (ref 0.0–5.0)
Cholesterol, Total: 129 mg/dL (ref 100–199)
HDL: 39 mg/dL — ABNORMAL LOW (ref >39)
LDL Calculated: 80 mg/dL (ref 0–99)
Triglycerides: 49 mg/dL (ref 0–149)
VLDL Cholesterol Cal: 10 mg/dL (ref 5–40)

## 2018-04-29 ENCOUNTER — Telehealth: Payer: Self-pay

## 2018-04-29 MED FILL — LISINOPRIL 10 MG TABS: 10 | 30 days supply | Qty: 45 | Fill #1

## 2018-04-29 MED FILL — AMLODIPINE BESYLATE 5 MG TA: 5 | 30 days supply | Qty: 30 | Fill #1

## 2018-04-29 NOTE — Telephone Encounter (Signed)
Contacted pt to go over lab results pt didn't answer and was unable to lvm

## 2018-05-28 MED FILL — AMLODIPINE BESYLATE 5 MG TA: 5 | 30 days supply | Qty: 30 | Fill #2

## 2018-05-28 MED FILL — LISINOPRIL 10 MG TABS: 10 | 30 days supply | Qty: 45 | Fill #2

## 2018-06-11 ENCOUNTER — Ambulatory Visit: Payer: Medicare Other | Admitting: Internal Medicine

## 2018-06-24 MED FILL — LISINOPRIL 10 MG TABS: 10 | 30 days supply | Qty: 45 | Fill #3

## 2018-06-24 MED FILL — AMLODIPINE BESYLATE 5 MG TA: 5 | 30 days supply | Qty: 30 | Fill #3

## 2018-08-05 MED FILL — LISINOPRIL 10 MG TABS: 10 | 90 days supply | Qty: 135 | Fill #4

## 2018-08-05 MED FILL — AMLODIPINE BESYLATE 5 MG TA: 5 | 90 days supply | Qty: 90 | Fill #4

## 2018-08-06 ENCOUNTER — Telehealth: Payer: Self-pay | Admitting: Internal Medicine

## 2018-08-06 NOTE — Telephone Encounter (Signed)
States that the a1c test was not covered through the insurance and would like to know another diagnoses code. Please follow up.

## 2018-08-07 NOTE — Telephone Encounter (Signed)
Return a call to [place] spoke with Lauren. Per Lauren the codes that Dr. Wynetta Emery provided was already used. Per Lauren they received a partial denial form medicare stating this test was not a medical necessity.

## 2018-08-07 NOTE — Telephone Encounter (Signed)
Will forward to pcp for another dx

## 2018-10-21 NOTE — Progress Notes (Signed)
Subjective:    Patient ID: Benjamin Nunez, male    DOB: [birth year], 68 y.o.   MRN: [id number]  68 y.o.M seen today as a work in visit for acute left shoulder pain.  The patient was at a graduation event 3 weeks ago and as he walked into the ER he slipped on wet grass on an incline and fell with his left arm straight into the ground injuring his left shoulder.  Since that time the patient is a decreased range of motion of the left shoulder and pain at the top of the shoulder radiating down into the upper arm  He did not initially seek medical attention.  He tried icy hot which has had minimal relief.  This awakens him from sleep and he cannot sleep on the left side.     Past Medical History:  Diagnosis Date  . Diabetes mellitus without complication ([place])    not on any meds   . Hypertension   . Sickle cell trait ([facility])      Family History  Problem Relation Age of Onset  . Diabetes Brother   . Hypertension Brother   . Asthma Neg Hx   . Cancer Neg Hx   . Heart failure Neg Hx   . Hyperlipidemia Neg Hx   . Colon cancer Neg Hx   . Colon polyps Neg Hx   . Esophageal cancer Neg Hx   . Stomach cancer Neg Hx   . Rectal cancer Neg Hx      Social History   Socioeconomic History  . Marital status: Married    Spouse name: Not on file  . Number of children: Not on file  . Years of education: Not on file  . Highest education level: 9th grade  Occupational History  . Not on file  Social Needs  . Financial resource strain: Not very hard  . Food insecurity    Worry: Sometimes true    Inability: Sometimes true  . Transportation needs    Medical: No    Non-medical: No  Tobacco Use  . Smoking status: Never Smoker  . Smokeless tobacco: Never Used  Substance and Sexual Activity  . Alcohol use: Yes    Comment: ocasionally  . Drug use: No  . Sexual activity: Not on file  Lifestyle  . Physical activity    Days per week: 4 days    Minutes per session: 30 min  . Stress: Not at  all  Relationships  . Social connections    Talks on phone: More than three times a week    Gets together: More than three times a week    Attends religious service: More than 4 times per year    Active member of club or organization: Yes    Attends meetings of clubs or organizations: More than 4 times per year    Relationship status: Married  . Intimate partner violence    Fear of current or ex partner: No    Emotionally abused: No    Physically abused: No    Forced sexual activity: No  Other Topics Concern  . Not on file  Social History Narrative  . Not on file     No Known Allergies   Outpatient Medications Prior to Visit  Medication Sig Dispense Refill  . amLODipine (NORVASC) 5 MG tablet Take 1 tablet (5 mg total) by mouth daily. 90 tablet 3  . lisinopril (PRINIVIL,ZESTRIL) 10 MG tablet Take 1.5 tablets (15 mg total) by mouth daily.  45 tablet 6  . diclofenac sodium (VOLTAREN) 1 % GEL Apply 2 g topically 4 (four) times daily. (Patient not taking: Reported on 10/22/2018) 100 g 1   No facility-administered medications prior to visit.     Review of Systems Constitutional:   No  weight loss, night sweats,  Fevers, chills, fatigue, lassitude. HEENT:   No headaches,  Difficulty swallowing,  Tooth/dental problems,  Sore throat,                No sneezing, itching, ear ache, nasal congestion, post nasal drip,   CV:  No chest pain,  Orthopnea, PND, swelling in lower extremities, anasarca, dizziness, palpitations  GI  No heartburn, indigestion, abdominal pain, nausea, vomiting, diarrhea, change in bowel habits, loss of appetite  Resp: No shortness of breath with exertion or at rest.  No excess mucus, no productive cough,  No non-productive cough,  No coughing up of blood.  No change in color of mucus.  No wheezing.  No chest wall deformity  Skin: no rash or lesions.  GU: no dysuria, change in color of urine, no urgency or frequency.  No flank pain.  MS:  Left shoulder joint pain  with decreased range of motion.  No back pain.  Psych:  No change in mood or affect. No depression or anxiety.  No memory loss.     Objective:   Physical Exam Vitals:   10/22/18 1009  BP: 134/90  Pulse: 78  Temp: 98.2 F (36.8 C)  TempSrc: Oral  SpO2: 98%  Weight: 179 lb (81.2 kg)  Height: 5\' 6"  (1.676 m)    Gen: Pleasant, well-nourished, in no distress,  normal affect  ENT: No lesions,  mouth clear,  oropharynx clear, no postnasal drip  Neck: No JVD, no TMG, no carotid bruits  Lungs: No use of accessory muscles, no dullness to percussion, clear without rales or rhonchi  Cardiovascular: RRR, heart sounds normal, no murmur or gallops, no peripheral edema  Abdomen: soft and NT, no HSM,  BS normal  Musculoskeletal: There is decreased range of motion of the left shoulder through all ranges.  There is tenderness in the acromio clavicular joint and also tenderness in the humerus down to the connection with the left upper arm however there is no evidence of misalignment or evidence for dislocation of the shoulder itself Neurovascular bundle is intact  Neuro: alert, non focal  Skin: Warm, no lesions or rashes     Assessment & Plan:  I personally reviewed all images and lab data in the [facility] system as well as any outside material available during this office visit and agree with the  radiology impressions.   Acute pain of left shoulder Acute pain of the left shoulder secondary to traumatic fall  Plan will be to image the left shoulder and prescribe Naprosyn 375 mg twice daily for 7 days then twice daily as needed thereafter  Depending upon the result of the shoulder x-ray a referral to orthopedic surgery may be indicated   Lot was seen today for shoulder pain.  Diagnoses and all orders for this visit:  Acute pain of left shoulder -     DG Shoulder Left; Future  Other orders -     naproxen (NAPROSYN) 375 MG tablet; Take one twice a day with meals for 7days then twice a  day as needed thereafter

## 2018-10-22 ENCOUNTER — Other Ambulatory Visit: Payer: Self-pay

## 2018-10-22 ENCOUNTER — Ambulatory Visit
Admission: RE | Admit: 2018-10-22 | Discharge: 2018-10-22 | Disposition: A | Discharge status: Home / Self Care | Payer: Medicare Other | Source: Ambulatory Visit | Attending: Critical Care Medicine | Admitting: Critical Care Medicine

## 2018-10-22 ENCOUNTER — Telehealth: Payer: Self-pay | Admitting: Internal Medicine

## 2018-10-22 ENCOUNTER — Ambulatory Visit: Payer: Medicare Other | Attending: Critical Care Medicine | Admitting: Critical Care Medicine

## 2018-10-22 ENCOUNTER — Encounter: Payer: Self-pay | Admitting: Critical Care Medicine

## 2018-10-22 VITALS — BP 134/90 | HR 78 | Temp 98.2°F | Ht 66.0 in | Wt 179.0 lb

## 2018-10-22 DIAGNOSIS — M25512 Pain in left shoulder: Secondary | ICD-10-CM

## 2018-10-22 MED ORDER — NAPROXEN 375 MG PO TABS: ORAL_TABLET | ORAL | 0 refills | Status: DC

## 2018-10-22 MED FILL — NAPROXEN 375 MG TABLET: 375 | 15 days supply | Qty: 30 | Fill #0

## 2018-10-22 NOTE — Progress Notes (Signed)
Left shoulder pain.

## 2018-10-22 NOTE — Assessment & Plan Note (Signed)
Acute pain of the left shoulder secondary to traumatic fall  Plan will be to image the left shoulder and prescribe Naprosyn 375 mg twice daily for 7 days then twice daily as needed thereafter  Depending upon the result of the shoulder x-ray a referral to orthopedic surgery may be indicated

## 2018-10-22 NOTE — Patient Instructions (Signed)
Take Naprosyn 375 mg twice daily for 1 week then twice daily as needed for pain in the shoulder, this was sent to our pharmacy  An x-ray of the left shoulder will be taken today we will call you with results

## 2018-10-22 NOTE — Telephone Encounter (Signed)
Patient would like to know his xray results

## 2018-10-23 NOTE — Telephone Encounter (Signed)
Dr. Joya Gaskins provided results to patient directly at 5:13pm on 10/22/2018

## 2018-11-19 ENCOUNTER — Other Ambulatory Visit: Payer: Self-pay | Admitting: Internal Medicine

## 2018-11-19 DIAGNOSIS — I10 Essential (primary) hypertension: Secondary | ICD-10-CM

## 2018-11-19 MED FILL — LISINOPRIL 10 MG TABS: 10 | 30 days supply | Qty: 45 | Fill #0

## 2018-11-19 MED FILL — AMLODIPINE BESYLATE 5 MG TA: 5 | 90 days supply | Qty: 90 | Fill #5

## 2018-12-19 ENCOUNTER — Ambulatory Visit: Payer: Medicare Other | Admitting: Internal Medicine

## 2018-12-20 ENCOUNTER — Other Ambulatory Visit: Payer: Self-pay | Admitting: Critical Care Medicine

## 2018-12-20 MED FILL — LISINOPRIL 10 MG TABS: 10 | 30 days supply | Qty: 45 | Fill #1

## 2018-12-23 MED FILL — NAPROXEN 375 MG TABLET: 375 | 15 days supply | Qty: 30 | Fill #0

## 2018-12-24 ENCOUNTER — Telehealth: Payer: Self-pay | Admitting: Internal Medicine

## 2018-12-24 NOTE — Telephone Encounter (Signed)
Both of these are ready for him at our pharmacy.

## 2018-12-24 NOTE — Telephone Encounter (Signed)
New Message   1) Medication(s) Requested (by name):  naproxen (NAPROSYN) 375 MG tablet and lisinopril (ZESTRIL) 10 MG tablet  2) Pharmacy of Choice: [facility]  3) Special Requests: Pt is requesting enough medication to last until his appt on 12/31/18   Approved medications will be sent to the pharmacy, we will reach out if there is an issue.  Requests made after 3pm may not be addressed until the following business day!  If a patient is unsure of the name of the medication(s) please note and ask patient to call back when they are able to provide all info, do not send to responsible party until all information is available!

## 2018-12-31 ENCOUNTER — Ambulatory Visit (INDEPENDENT_AMBULATORY_CARE_PROVIDER_SITE_OTHER): Payer: Medicare Other | Admitting: Internal Medicine

## 2018-12-31 ENCOUNTER — Other Ambulatory Visit: Payer: Self-pay

## 2018-12-31 VITALS — BP 141/77 | HR 71 | Temp 97.3°F | Resp 16 | Wt 182.8 lb

## 2018-12-31 DIAGNOSIS — M25512 Pain in left shoulder: Secondary | ICD-10-CM

## 2018-12-31 DIAGNOSIS — G8929 Other chronic pain: Secondary | ICD-10-CM | POA: Diagnosis not present

## 2018-12-31 DIAGNOSIS — I10 Essential (primary) hypertension: Secondary | ICD-10-CM

## 2018-12-31 DIAGNOSIS — Z2821 Immunization not carried out because of patient refusal: Secondary | ICD-10-CM

## 2018-12-31 DIAGNOSIS — Z2882 Immunization not carried out because of caregiver refusal: Secondary | ICD-10-CM

## 2018-12-31 MED ORDER — TRAMADOL HCL 50 MG PO TABS: 50.0000 mg | ORAL_TABLET | Freq: Two times a day (BID) | ORAL | 0 refills | Status: DC | PRN

## 2018-12-31 MED ORDER — AMLODIPINE BESYLATE 10 MG PO TABS: 10.0000 mg | ORAL_TABLET | Freq: Every day | ORAL | 5 refills | Status: DC

## 2018-12-31 NOTE — Progress Notes (Signed)
Patient ID: Benjamin Nunez, male    DOB: 07/27/1950  MRN: [id number]  CC: Shoulder Pain and Medication Refill   Subjective: Benjamin Nunez is a 68 y.o. male who presents for chronic ds management His concerns today include:  HTN, adenomatous colon polyps  LT shoulder pain:  Pt slipped and fell in July while walking from on grass to concrete sidewalk.  Fell on his left shoulder. Saw Dr. Joya Nunez for same in July. Had x-ray which was neg for acute fracture.  Showed mild arthritis.  He was prescribed Naprosyn to take as needed.  He has been taking the Naprosyn but reports that the pain has not decreased. Pain is constant but worse at nights.  It throbs.  Pain limits his movement of the jt.  He uses his right arm, just to avoid moving the left shoulder.  HYPERTENSION Currently taking: see medication list Med Adherence: [x]  Yes.  He is on lisinopril and amlodipine.  He is taken medicines already for today.    []  No Medication side effects: []  Yes    [x]  No Adherence with salt restriction: [x]  Yes    []  No Home Monitoring?: [x]  Yes    []  No Monitoring Frequency: QOD.  He forgot to bring log  Home BP results range: SBP 130-140/ SOB? []  Yes    [x]  No Chest Pain?: []  Yes    [x]  No Leg swelling?: []  Yes    [x]  No Headaches?: []  Yes    [x]  No Dizziness? []  Yes    [x]  No Comments:   Patient Active Problem List   Diagnosis Date Noted  . Acute pain of left shoulder 10/22/2018  . Over weight 04/02/2018  . Adenomatous polyp of colon 03/02/2017  . Essential hypertension 09/18/2016     Current Outpatient Medications on File Prior to Visit  Medication Sig Dispense Refill  . amLODipine (NORVASC) 5 MG tablet Take 1 tablet (5 mg total) by mouth daily. 90 tablet 3  . lisinopril (ZESTRIL) 10 MG tablet TAKE 1.5 TABLETS (15 MG TOTAL) BY MOUTH DAILY. 45 tablet 2  . naproxen (NAPROSYN) 375 MG tablet TAKE ONE TABLET BY MOUTH TWICE A DAY WITH MEALS FOR 7DAYS THEN TWICE A DAY AS NEEDED THEREAFTER 30  tablet 0   No current facility-administered medications on file prior to visit.     No Known Allergies  Social History   Socioeconomic History  . Marital status: Married    Spouse name: Not on file  . Number of children: Not on file  . Years of education: Not on file  . Highest education level: 9th grade  Occupational History  . Not on file  Social Needs  . Financial resource strain: Not very hard  . Food insecurity    Worry: Sometimes true    Inability: Sometimes true  . Transportation needs    Medical: No    Non-medical: No  Tobacco Use  . Smoking status: Never Smoker  . Smokeless tobacco: Never Used  Substance and Sexual Activity  . Alcohol use: Yes    Comment: ocasionally  . Drug use: No  . Sexual activity: Not on file  Lifestyle  . Physical activity    Days per week: 4 days    Minutes per session: 30 min  . Stress: Not at all  Relationships  . Social connections    Talks on phone: More than three times a week    Gets together: More than three times a week    Attends  religious service: More than 4 times per year    Active member of club or organization: Yes    Attends meetings of clubs or organizations: More than 4 times per year    Relationship status: Married  . Intimate partner violence    Fear of current or ex partner: No    Emotionally abused: No    Physically abused: No    Forced sexual activity: No  Other Topics Concern  . Not on file  Social History Narrative  . Not on file    Family History  Problem Relation Age of Onset  . Diabetes Brother   . Hypertension Brother   . Asthma Neg Hx   . Cancer Neg Hx   . Heart failure Neg Hx   . Hyperlipidemia Neg Hx   . Colon cancer Neg Hx   . Colon polyps Neg Hx   . Esophageal cancer Neg Hx   . Stomach cancer Neg Hx   . Rectal cancer Neg Hx     Past Surgical History:  Procedure Laterality Date  . ORIF FEMUR FRACTURE  1970s    ROS: Review of Systems Negative except as stated  above  PHYSICAL EXAM: BP (!) 141/77   Pulse 71   Temp (!) 97.3 F (36.3 C) (Temporal)   Resp 16   Wt 182 lb 12.8 oz (82.9 kg)   SpO2 96%   BMI 29.50 kg/m   Physical Exam General appearance - alert, well appearing, and in no distress Mental status - normal mood, behavior, speech, dress, motor activity, and thought processes Neck - supple, no significant adenopathy Chest - clear to auscultation, no wheezes, rales or rhonchi, symmetric air entry Heart - normal rate, regular rhythm, normal S1, S2, no murmurs, rubs, clicks or gallops Musculoskeletal -left shoulder: No point tenderness on palpation of the anterior and posterior joint.  Drop arm test is positive.  Elevation beyond 120 degrees is difficult extremities - peripheral pulses normal, no pedal edema, no clubbing or cyanosis  CMP Latest Ref Rng & Units 07/31/2017 09/18/2016  Glucose 65 - 99 mg/dL 87 84  BUN 8 - 27 mg/dL 8 9  Creatinine 0.76 - 1.27 mg/dL 0.92 0.96  Sodium 134 - 144 mmol/L 141 146(H)  Potassium 3.5 - 5.2 mmol/L 4.2 3.9  Chloride 96 - 106 mmol/L 104 107(H)  CO2 20 - 29 mmol/L 23 24  Calcium 8.6 - 10.2 mg/dL 9.4 9.6  Total Protein 6.0 - 8.5 g/dL 7.4 7.4  Total Bilirubin 0.0 - 1.2 mg/dL 0.5 0.4  Alkaline Phos 39 - 117 IU/L 58 57  AST 0 - 40 IU/L 15 12  ALT 0 - 44 IU/L 14 12   Lipid Panel     Component Value Date/Time   CHOL 129 04/24/2018 1113   TRIG 49 04/24/2018 1113   HDL 39 (L) 04/24/2018 1113   CHOLHDL 3.3 04/24/2018 1113   LDLCALC 80 04/24/2018 1113    CBC    Component Value Date/Time   WBC 3.8 07/31/2017 1522   RBC 4.80 07/31/2017 1522   HGB 13.7 07/31/2017 1522   HCT 40.2 07/31/2017 1522   PLT 189 07/31/2017 1522   MCV 84 07/31/2017 1522   MCH 28.5 07/31/2017 1522   MCHC 34.1 07/31/2017 1522   RDW 15.3 07/31/2017 1522    ASSESSMENT AND PLAN: 1. Chronic left shoulder pain I suspect rotator cuff injury.  I recommend an MRI.  However patient did not want to do the imaging. Recommend  referral to  orthopedics for further evaluation. I have given a prescription for tramadol to use as needed particularly at nighttime since pain interferes with sleep.  Advised that the medication can cause some drowsiness and that it is a controlled substance. [place] controlled substance reporting system reviewed. - traMADol (ULTRAM) 50 MG tablet; Take 1 tablet (50 mg total) by mouth 2 (two) times daily as needed.  Dispense: 60 tablet; Refill: 0 - Ambulatory referral to Orthopedic Surgery  2. Essential hypertension Not at goal.  Increase amlodipine to 10 mg.  Continue lisinopril - amLODipine (NORVASC) 10 MG tablet; Take 1 tablet (10 mg total) by mouth daily.  Dispense: 30 tablet; Refill: 5  3. Influenza vaccination declined   4. Pneumococcal vaccination declined by caregiver   5. Tetanus, diphtheria, and acellular pertussis (Tdap) vaccination declined     Patient was given the opportunity to ask questions.  Patient verbalized understanding of the plan and was able to repeat key elements of the plan.   No orders of the defined types were placed in this encounter.    Requested Prescriptions    No prescriptions requested or ordered in this encounter    No follow-ups on file.  Karle Plumber, MD, FACP

## 2018-12-31 NOTE — Patient Instructions (Signed)
I have referred you to orthopedics. Take the Tramadol as needed for pain.  Increase Amlodipine to 10 mg daily.

## 2018-12-31 NOTE — Progress Notes (Signed)
Still having L shoulder pain. No real improvement since July appointment.  Describes the pain as throbbing pain that radiates down to his elbow. Rates pain as 8/10. Pain gets better while on Naproxen but immediately returns when dose wears off.  States that pain is affecting his sleep.

## 2019-01-02 ENCOUNTER — Other Ambulatory Visit: Payer: Self-pay | Admitting: Internal Medicine

## 2019-01-02 ENCOUNTER — Encounter: Payer: Self-pay | Admitting: Internal Medicine

## 2019-01-02 MED ORDER — DICLOFENAC SODIUM 1 % TD GEL: 2.0000 g | Freq: Four times a day (QID) | TRANSDERMAL | 1 refills | Status: DC

## 2019-01-02 NOTE — Telephone Encounter (Signed)
Patient called stating he is expecting to be prescribed a gel/cream for shoulder his pain. Pharmacy states they have not received a script. Please follow up.

## 2019-01-02 NOTE — Telephone Encounter (Signed)
Please advise on cream for pain

## 2019-01-02 NOTE — Progress Notes (Signed)
I received a message from orthopedics stating that patient declined referral.

## 2019-01-03 MED FILL — DICLOFENAC SODIUM 1% GEL: 1 | 12 days supply | Qty: 100 | Fill #0

## 2019-01-06 NOTE — Telephone Encounter (Signed)
Spoke to patient wife. Aware that medication was sent to the pharmacy.

## 2019-01-22 ENCOUNTER — Other Ambulatory Visit: Payer: Self-pay | Admitting: Pharmacist

## 2019-01-22 DIAGNOSIS — I10 Essential (primary) hypertension: Secondary | ICD-10-CM

## 2019-01-22 MED ORDER — LISINOPRIL 10 MG PO TABS: 15.0000 mg | ORAL_TABLET | Freq: Every day | ORAL | 0 refills | Status: DC

## 2019-01-22 MED ORDER — AMLODIPINE BESYLATE 10 MG PO TABS: 10.0000 mg | ORAL_TABLET | Freq: Every day | ORAL | 1 refills | Status: DC

## 2019-01-22 MED FILL — LISINOPRIL 10 MG TABS: 10 | 90 days supply | Qty: 135 | Fill #0

## 2019-01-24 ENCOUNTER — Other Ambulatory Visit: Payer: Self-pay

## 2019-01-24 ENCOUNTER — Ambulatory Visit: Payer: Medicare Other | Attending: Family Medicine | Admitting: Pharmacist

## 2019-01-24 DIAGNOSIS — Z23 Encounter for immunization: Secondary | ICD-10-CM | POA: Diagnosis not present

## 2019-01-24 NOTE — Progress Notes (Signed)
Patient presents for vaccination against influenza per orders of Dr. Johnson. Consent given. Counseling provided. No contraindications exists. Vaccine administered without incident.   

## 2019-01-27 ENCOUNTER — Ambulatory Visit: Payer: Medicare Other | Admitting: Pharmacist

## 2019-02-07 MED FILL — traMADol HCL 50 MG TABS: 50 | 7 days supply | Qty: 14 | Fill #1

## 2019-03-24 MED FILL — AMLODIPINE BESYLATE 10 MG T: 10 | 30 days supply | Qty: 30 | Fill #1

## 2019-04-03 ENCOUNTER — Ambulatory Visit: Payer: Medicare Other | Attending: Internal Medicine | Admitting: Internal Medicine

## 2019-04-03 ENCOUNTER — Encounter: Payer: Self-pay | Admitting: Internal Medicine

## 2019-04-03 ENCOUNTER — Other Ambulatory Visit: Payer: Self-pay

## 2019-04-03 DIAGNOSIS — G8929 Other chronic pain: Secondary | ICD-10-CM

## 2019-04-03 DIAGNOSIS — I10 Essential (primary) hypertension: Secondary | ICD-10-CM | POA: Diagnosis not present

## 2019-04-03 DIAGNOSIS — M25512 Pain in left shoulder: Secondary | ICD-10-CM | POA: Diagnosis not present

## 2019-04-03 MED ORDER — LISINOPRIL 10 MG PO TABS: 15.0000 mg | ORAL_TABLET | Freq: Every day | ORAL | 2 refills | Status: DC

## 2019-04-03 NOTE — Progress Notes (Signed)
Virtual Visit via Telephone Note Due to current restrictions/limitations of in-office visits due to the COVID-19 pandemic, this scheduled clinical appointment was converted to a telehealth visit  I connected with Benjamin Nunez on 04/03/19 at 1:42 p.m by telephone and verified that I am speaking with the correct person using two identifiers. I am in my office.  The patient is at home.  Only the patient and myself participated in this encounter.  I discussed the limitations, risks, security and privacy concerns of performing an evaluation and management service by telephone and the availability of in person appointments. I also discussed with the patient that there may be a patient responsible charge related to this service. The patient expressed understanding and agreed to proceed.   History of Present Illness: HTN, adenomatous colon polyps.  Patient last evaluated 12/2018  LT shoulder pain: reports that the Voltaren Gel helps a lot with the pain.  Uses it every other day.  Pain still there but not any worse.  He is able to use the arm but avoids heavy lifting.  He decline appt with ortho when they called him because he states he will never want surgery.  HYPERTENSION Currently taking: see medication list Med Adherence: [x]  Yes    []  No Medication side effects: []  Yes    [x]  No Adherence with salt restriction: [x]  Yes    []  No Home Monitoring?: [x]  Yes    []  No Monitoring Frequency:  Every other day Home BP results range:  He has not written readings down and does not recall any readings at this time SOB? []  Yes    [x]  No Chest Pain?: []  Yes    [x]  No Leg swelling?: []  Yes    [x]  No Headaches?: []  Yes    [x]  No Dizziness? []  Yes    [x]  No Comments:  Walk 2-3 blocks 2-3 times a wk  Outpatient Encounter Medications as of 04/03/2019  Medication Sig  . amLODipine (NORVASC) 10 MG tablet Take 1 tablet (10 mg total) by mouth daily.  . diclofenac sodium (VOLTAREN) 1 % GEL Apply 2 g topically  4 (four) times daily.  Marland Kitchen lisinopril (ZESTRIL) 10 MG tablet Take 1.5 tablets (15 mg total) by mouth daily.  . naproxen (NAPROSYN) 375 MG tablet TAKE ONE TABLET BY MOUTH TWICE A DAY WITH MEALS FOR 7DAYS THEN TWICE A DAY AS NEEDED THEREAFTER (Patient not taking: Reported on 04/03/2019)  . traMADol (ULTRAM) 50 MG tablet Take 1 tablet (50 mg total) by mouth 2 (two) times daily as needed. (Patient not taking: Reported on 04/03/2019)   No facility-administered encounter medications on file as of 04/03/2019.      Observations/Objective: No directed obeservation done  Assessment and Plan: 1. Essential hypertension Level of control unknown.  He will continue taking amlodipine and lisinopril. Advised to check blood pressure at least twice a week and keep a log of the date that it was checked and blood pressure reading.  Asked to bring in his log on his next visit with [place].  Continue low-salt diet.  Continue regular exercise - CBC; Future - Lipid panel; Future - Comprehensive metabolic panel; Future - lisinopril (ZESTRIL) 10 MG tablet; Take 1.5 tablets (15 mg total) by mouth daily.  Dispense: 135 tablet; Refill: 2  2. Chronic left shoulder pain No progression of symptoms.  He is doing well with just using the Voltaren gel every other day.  He will continue to use that as needed   Follow Up Instructions: F/u in  3-4 mths   I discussed the assessment and treatment plan with the patient. The patient was provided an opportunity to ask questions and all were answered. The patient agreed with the plan and demonstrated an understanding of the instructions.   The patient was advised to call back or seek an in-person evaluation if the symptoms worsen or if the condition fails to improve as anticipated.  I provided 8 minutes of non-face-to-face time during this encounter.   Karle Plumber, MD

## 2019-04-07 ENCOUNTER — Ambulatory Visit: Payer: Medicare Other | Attending: Internal Medicine

## 2019-04-07 ENCOUNTER — Other Ambulatory Visit: Payer: Self-pay

## 2019-04-07 DIAGNOSIS — I10 Essential (primary) hypertension: Secondary | ICD-10-CM

## 2019-04-08 LAB — COMPREHENSIVE METABOLIC PANEL
ALT: 14 IU/L (ref 0–44)
AST: 14 IU/L (ref 0–40)
Albumin/Globulin Ratio: 1.8 (ref 1.2–2.2)
Albumin: 4.6 g/dL (ref 3.8–4.8)
Alkaline Phosphatase: 70 IU/L (ref 39–117)
BUN/Creatinine Ratio: 11 (ref 10–24)
BUN: 11 mg/dL (ref 8–27)
Bilirubin Total: 0.7 mg/dL (ref 0.0–1.2)
CO2: 23 mmol/L (ref 20–29)
Calcium: 9.6 mg/dL (ref 8.6–10.2)
Chloride: 103 mmol/L (ref 96–106)
Creatinine, Ser: 0.99 mg/dL (ref 0.76–1.27)
GFR calc Af Amer: 91 mL/min/{1.73_m2} (ref >59)
GFR calc non Af Amer: 78 mL/min/{1.73_m2} (ref >59)
Globulin, Total: 2.6 g/dL (ref 1.5–4.5)
Glucose: 84 mg/dL (ref 65–99)
Potassium: 4.1 mmol/L (ref 3.5–5.2)
Sodium: 140 mmol/L (ref 134–144)
Total Protein: 7.2 g/dL (ref 6.0–8.5)

## 2019-04-08 LAB — CBC
Hematocrit: 40.6 % (ref 37.5–51.0)
Hemoglobin: 13.9 g/dL (ref 13.0–17.7)
MCH: 28.8 pg (ref 26.6–33.0)
MCHC: 34.2 g/dL (ref 31.5–35.7)
MCV: 84 fL (ref 79–97)
Platelets: 189 10*3/uL (ref 150–450)
RBC: 4.83 x10E6/uL (ref 4.14–5.80)
RDW: 13.3 % (ref 11.6–15.4)
WBC: 3.7 10*3/uL (ref 3.4–10.8)

## 2019-04-08 LAB — LIPID PANEL
Chol/HDL Ratio: 3.4 ratio (ref 0.0–5.0)
Cholesterol, Total: 123 mg/dL (ref 100–199)
HDL: 36 mg/dL — ABNORMAL LOW (ref >39)
LDL Chol Calc (NIH): 77 mg/dL (ref 0–99)
Triglycerides: 43 mg/dL (ref 0–149)
VLDL Cholesterol Cal: 10 mg/dL (ref 5–40)

## 2019-04-16 MED FILL — traMADol HCL 50 MG TABS: 50 | 7 days supply | Qty: 14 | Fill #2

## 2019-04-18 ENCOUNTER — Telehealth: Payer: Self-pay

## 2019-04-18 NOTE — Telephone Encounter (Signed)
Contacted pt to go over lab results pt didn't answer left a detailed vm informing pt of results and if he has any questions or concerns to give me a call  

## 2019-05-06 MED FILL — AMLODIPINE BESYLATE 10 MG T: 10 | 90 days supply | Qty: 90 | Fill #2

## 2019-05-09 MED FILL — LISINOPRIL 10 MG TABS: 10 | 90 days supply | Qty: 135 | Fill #0

## 2019-06-12 ENCOUNTER — Ambulatory Visit: Payer: Medicare Other | Attending: Internal Medicine

## 2019-07-07 ENCOUNTER — Ambulatory Visit: Payer: Medicare Other | Attending: Internal Medicine | Admitting: Internal Medicine

## 2019-07-07 ENCOUNTER — Encounter: Payer: Self-pay | Admitting: Internal Medicine

## 2019-07-07 ENCOUNTER — Other Ambulatory Visit: Payer: Self-pay

## 2019-07-07 VITALS — BP 134/80 | HR 70 | Temp 98.1°F | Resp 16 | Wt 183.6 lb

## 2019-07-07 DIAGNOSIS — G8929 Other chronic pain: Secondary | ICD-10-CM

## 2019-07-07 DIAGNOSIS — I10 Essential (primary) hypertension: Secondary | ICD-10-CM | POA: Diagnosis not present

## 2019-07-07 DIAGNOSIS — M25512 Pain in left shoulder: Secondary | ICD-10-CM | POA: Diagnosis not present

## 2019-07-07 NOTE — Patient Instructions (Signed)
Continue to monitor your blood pressure.  Try checking it about 2 hours after you take your medications.  If you do get the Covid vaccine before I see you again, please make sure to bring your card with you on your next visit so that we can enter the dates that you received the vaccine into our system.

## 2019-07-07 NOTE — Progress Notes (Signed)
Patient ID: Carter Auth, male    DOB: 1950-12-18  MRN: [id number]  CC: Hypertension   Subjective: Benjamin Nunez is a 69 y.o. male who presents for chronic ds management His concerns today include:  HTN, adenomatous colon polyps.   HYPERTENSION Currently taking: see medication list Med Adherence: [x]  Yes    []  No Medication side effects: []  Yes    [x]  No Adherence with salt restriction: [x]  Yes    []  No Home Monitoring?: [x]  Yes    []  No Monitoring Frequency: daily in mornings before med.  He brings log and his home BP monitoring device with him Home BP results range: 134/72, 129/75, 134/75, 138/71, 128/68, 122/72, 127/71, 131/74, 143/77 SOB? []  Yes    [x]  No Chest Pain?: []  Yes    [x]  No Leg swelling?: []  Yes    [x]  No Headaches?: []  Yes    [x]  No Dizziness? []  Yes    [x]  No Comments: walks 3-4 times a wk about 3-4 blocks in his neighborhood  LT shoulder pain: shoulder much better but still bothered with pain at times. Able to use the arm but avoids strenuous lifting or over head liftin  HM:  Last eye exam was 6 mths ago by Dr. Gershon Crane Patient Active Problem List   Diagnosis Date Noted  . Acute pain of left shoulder 10/22/2018  . Over weight 04/02/2018  . Adenomatous polyp of colon 03/02/2017  . Essential hypertension 09/18/2016     Current Outpatient Medications on File Prior to Visit  Medication Sig Dispense Refill  . amLODipine (NORVASC) 10 MG tablet Take 1 tablet (10 mg total) by mouth daily. 90 tablet 1  . diclofenac sodium (VOLTAREN) 1 % GEL Apply 2 g topically 4 (four) times daily. (Patient not taking: Reported on 07/07/2019) 100 g 1  . lisinopril (ZESTRIL) 10 MG tablet Take 1.5 tablets (15 mg total) by mouth daily. 135 tablet 2   No current facility-administered medications on file prior to visit.    No Known Allergies  Social History   Socioeconomic History  . Marital status: Married    Spouse name: Not on file  . Number of children: Not on file   . Years of education: Not on file  . Highest education level: 9th grade  Occupational History  . Not on file  Tobacco Use  . Smoking status: Never Smoker  . Smokeless tobacco: Never Used  Substance and Sexual Activity  . Alcohol use: Yes    Comment: ocasionally  . Drug use: No  . Sexual activity: Not on file  Other Topics Concern  . Not on file  Social History Narrative  . Not on file   Social Determinants of Health   Financial Resource Strain:   . Difficulty of Paying Living Expenses:   Food Insecurity:   . Worried About Charity fundraiser in the Last Year:   . Arboriculturist in the Last Year:   Transportation Needs:   . Film/video editor (Medical):   Marland Kitchen Lack of Transportation (Non-Medical):   Physical Activity:   . Days of Exercise per Week:   . Minutes of Exercise per Session:   Stress:   . Feeling of Stress :   Social Connections:   . Frequency of Communication with Friends and Family:   . Frequency of Social Gatherings with Friends and Family:   . Attends Religious Services:   . Active Member of Clubs or Organizations:   . Attends Club or  Organization Meetings:   Marland Kitchen Marital Status:   Intimate Partner Violence:   . Fear of Current or Ex-Partner:   . Emotionally Abused:   Marland Kitchen Physically Abused:   . Sexually Abused:     Family History  Problem Relation Age of Onset  . Diabetes Brother   . Hypertension Brother   . Asthma Neg Hx   . Cancer Neg Hx   . Heart failure Neg Hx   . Hyperlipidemia Neg Hx   . Colon cancer Neg Hx   . Colon polyps Neg Hx   . Esophageal cancer Neg Hx   . Stomach cancer Neg Hx   . Rectal cancer Neg Hx     Past Surgical History:  Procedure Laterality Date  . ORIF FEMUR FRACTURE  1970s    ROS: Review of Systems Negative except as stated above  PHYSICAL EXAM: BP 134/80   Pulse 70   Temp 98.1 F (36.7 C)   Resp 16   Wt 183 lb 9.6 oz (83.3 kg)   SpO2 97%   BMI 29.63 kg/m   Wt Readings from Last 3 Encounters:    07/07/19 183 lb 9.6 oz (83.3 kg)  12/31/18 182 lb 12.8 oz (82.9 kg)  10/22/18 179 lb (81.2 kg)   BP 140/80 Physical Exam  General appearance - alert, well appearing, and in no distress Mental status - normal mood, behavior, speech, dress, motor activity, and thought processes Neck - supple, no significant adenopathy Chest - clear to auscultation, no wheezes, rales or rhonchi, symmetric air entry Heart - normal rate, regular rhythm, normal S1, S2, no murmurs, rubs, clicks or gallops Extremities - peripheral pulses normal, no pedal edema, no clubbing or cyanosis  CMP Latest Ref Rng & Units 04/07/2019 07/31/2017 09/18/2016  Glucose 65 - 99 mg/dL 84 87 84  BUN 8 - 27 mg/dL 11 8 9   Creatinine 0.76 - 1.27 mg/dL 0.99 0.92 0.96  Sodium 134 - 144 mmol/L 140 141 146(H)  Potassium 3.5 - 5.2 mmol/L 4.1 4.2 3.9  Chloride 96 - 106 mmol/L 103 104 107(H)  CO2 20 - 29 mmol/L 23 23 24   Calcium 8.6 - 10.2 mg/dL 9.6 9.4 9.6  Total Protein 6.0 - 8.5 g/dL 7.2 7.4 7.4  Total Bilirubin 0.0 - 1.2 mg/dL 0.7 0.5 0.4  Alkaline Phos 39 - 117 IU/L 70 58 57  AST 0 - 40 IU/L 14 15 12   ALT 0 - 44 IU/L 14 14 12    Lipid Panel     Component Value Date/Time   CHOL 123 04/07/2019 1050   TRIG 43 04/07/2019 1050   HDL 36 (L) 04/07/2019 1050   CHOLHDL 3.4 04/07/2019 1050   LDLCALC 77 04/07/2019 1050    CBC    Component Value Date/Time   WBC 3.7 04/07/2019 1050   RBC 4.83 04/07/2019 1050   HGB 13.9 04/07/2019 1050   HCT 40.6 04/07/2019 1050   PLT 189 04/07/2019 1050   MCV 84 04/07/2019 1050   MCH 28.8 04/07/2019 1050   MCHC 34.2 04/07/2019 1050   RDW 13.3 04/07/2019 1050    ASSESSMENT AND PLAN: 1. Essential hypertension Home blood pressure reading for the most part are good.  He has somewhere systolic blood pressure is higher than goal.  However I do not think we need to make any change in his medication regimen today.  He will continue amlodipine and lisinopril at current doses.  Advised him to check  blood pressure about 2 hours after he takes his  medication at least twice a week.  Went over the goal of blood pressure being 130/80 or lower.  2. Chronic left shoulder pain Improved.  He will continue the Voltaren gel as needed    Patient was given the opportunity to ask questions.  Patient verbalized understanding of the plan and was able to repeat key elements of the plan.   No orders of the defined types were placed in this encounter.    Requested Prescriptions    No prescriptions requested or ordered in this encounter    Return in about 6 weeks (around 08/18/2019) for Medicare Wellness Visit Subsequent (last one done 09/2017). Karle Plumber, MD, FACP

## 2019-07-10 ENCOUNTER — Ambulatory Visit: Payer: Medicare Other | Attending: Internal Medicine

## 2019-07-10 DIAGNOSIS — Z23 Encounter for immunization: Secondary | ICD-10-CM

## 2019-07-10 NOTE — Progress Notes (Signed)
   Covid-19 Vaccination Clinic  Name:  Kaaden Bencosme    MRN: [id number] DOB: February 26, 1951  07/10/2019  Mr. Halaby was observed post Covid-19 immunization for 15 minutes without incident. He was provided with Vaccine Information Sheet and instruction to access the V-Safe system.   Mr. Rai was instructed to call 911 with any severe reactions post vaccine: Marland Kitchen Difficulty breathing  . Swelling of face and throat  . A fast heartbeat  . A bad rash all over body  . Dizziness and weakness   Immunizations Administered    Name Date Dose VIS Date Route   Pfizer COVID-19 Vaccine 07/10/2019 11:29 AM 0.3 mL 03/07/2019 Intramuscular   Manufacturer: [place]   Lot: [id number]   [place]: [id number]

## 2019-08-04 ENCOUNTER — Ambulatory Visit: Payer: Medicare Other | Attending: Internal Medicine

## 2019-08-04 DIAGNOSIS — Z23 Encounter for immunization: Secondary | ICD-10-CM

## 2019-08-04 NOTE — Progress Notes (Signed)
   Covid-19 Vaccination Clinic  Name:  Benjamin Nunez    MRN: [id number] DOB: 05-Nov-1950  08/04/2019  [name] was observed post Covid-19 immunization for 15 minutes without incident. He was provided with Vaccine Information Sheet and instruction to access the V-Safe system.   [name] was instructed to call 911 with any severe reactions post vaccine: Marland Kitchen Difficulty breathing  . Swelling of face and throat  . A fast heartbeat  . A bad rash all over body  . Dizziness and weakness   Immunizations Administered    Name Date Dose VIS Date Route   Pfizer COVID-19 Vaccine 08/04/2019 11:31 AM 0.3 mL 05/21/2018 Intramuscular   Manufacturer: [place]   Lot: [id number]   [place]: [id number]

## 2019-08-14 ENCOUNTER — Ambulatory Visit: Payer: Medicare Other | Admitting: Internal Medicine

## 2019-08-15 ENCOUNTER — Encounter: Payer: Self-pay | Admitting: Pharmacist

## 2019-08-15 ENCOUNTER — Ambulatory Visit
Admission: RE | Admit: 2019-08-15 | Discharge: 2019-08-15 | Disposition: A | Discharge status: Home / Self Care | Payer: Medicare Other | Source: Ambulatory Visit | Attending: Internal Medicine | Admitting: Internal Medicine

## 2019-08-15 ENCOUNTER — Other Ambulatory Visit: Payer: Self-pay

## 2019-08-15 ENCOUNTER — Ambulatory Visit: Payer: Medicare Other | Attending: Internal Medicine | Admitting: Pharmacist

## 2019-08-15 VITALS — BP 137/76 | HR 78 | Temp 97.8°F | Ht 68.0 in | Wt 180.4 lb

## 2019-08-15 DIAGNOSIS — M19011 Primary osteoarthritis, right shoulder: Secondary | ICD-10-CM | POA: Diagnosis not present

## 2019-08-15 DIAGNOSIS — M25511 Pain in right shoulder: Secondary | ICD-10-CM

## 2019-08-15 DIAGNOSIS — Z0000 Encounter for general adult medical examination without abnormal findings: Secondary | ICD-10-CM | POA: Diagnosis not present

## 2019-08-15 DIAGNOSIS — Z131 Encounter for screening for diabetes mellitus: Secondary | ICD-10-CM | POA: Diagnosis not present

## 2019-08-15 NOTE — Progress Notes (Signed)
Subjective:   Benjamin Nunez is a 69 y.o. male who presents for Medicare Annual/Subsequent preventive examination. Pt presents with CC of R shoulder pain. Describes the pain: "the pain is killing me." ROM limited. Pt reports that the pain is affecting his sleep. Additionally, he is unable to perform some daily tasks such as tying his shoes. Has tried Voltaren gel along with ice/heat with no improvement. Has not tried any OTC PO medications. No other complaints.   Objective:    Vitals: BP 137/76   Pulse 78   Temp 97.8 F (36.6 C)   Ht 5\' 8"  (1.727 m)   Wt 180 lb 6.4 oz (81.8 kg)   BMI 27.43 kg/m   Body mass index is 27.43 kg/m.  Advanced Directives 08/15/2019 10/24/2017 11/08/2016 09/18/2016  Does Patient Have a Medical Advance Directive? No No No No  Would patient like information on creating a medical advance directive? No - Patient declined No - Patient declined - No - Patient declined   Tobacco Social History   Tobacco Use  Smoking Status Never Smoker  Smokeless Tobacco Never Used      Clinical Intake: Pre-visit preparation completed: Yes  Pain : 0-10 Pain Score: 9  Pain Type: Acute pain Pain Location: Shoulder Pain Orientation: Right Pain Descriptors / Indicators: Sharp, Discomfort Pain Onset: 1 to 4 weeks ago(2 weeks ago; has tried Voltaren gel along with ice/heat with no improvement.) Pain Frequency: Constant Pain Relieving Factors: None Effect of Pain on Daily Activities: Limits; can't tie shoes or sleep comfortably  Pain Relieving Factors: None  Diabetes: No  How often do you need to have someone help you when you read instructions, pamphlets, or other written materials from your doctor or pharmacy?: 1 - Never  Interpreter Needed?: No  Past Medical History:  Diagnosis Date  . Diabetes mellitus without complication ([place])    not on any meds   . Hypertension   . Sickle cell trait [facility])    Past Surgical History:  Procedure Laterality Date  . ORIF  FEMUR FRACTURE  1970s   Family History  Problem Relation Age of Onset  . Diabetes Brother   . Hypertension Brother   . Asthma Neg Hx   . Cancer Neg Hx   . Heart failure Neg Hx   . Hyperlipidemia Neg Hx   . Colon cancer Neg Hx   . Colon polyps Neg Hx   . Esophageal cancer Neg Hx   . Stomach cancer Neg Hx   . Rectal cancer Neg Hx     Outpatient Encounter Medications as of 08/15/2019  Medication Sig  . amLODipine (NORVASC) 10 MG tablet Take 1 tablet (10 mg total) by mouth daily.  . diclofenac sodium (VOLTAREN) 1 % GEL Apply 2 g topically 4 (four) times daily.  Marland Kitchen lisinopril (ZESTRIL) 10 MG tablet Take 1.5 tablets (15 mg total) by mouth daily.   No facility-administered encounter medications on file as of 08/15/2019.   Activities of Daily Living In your present state of health, do you have any difficulty performing the following activities: 08/15/2019  Hearing? N  Vision? N  Difficulty concentrating or making decisions? N  Walking or climbing stairs? N  Dressing or bathing? N  Doing errands, shopping? N  Preparing Food and eating ? N  Using the Toilet? N  In the past six months, have you accidently leaked urine? N  Do you have problems with loss of bowel control? N  Managing your Medications? N  Managing your  Finances? N  Housekeeping or managing your Housekeeping? N  Some recent data might be hidden   Patient Care Team: Ladell Pier, MD as PCP - General (Internal Medicine)   Assessment:   This is a routine wellness examination for Benjamin Nunez.  Exercise Activities and Dietary recommendations Current Exercise Habits: The patient does not participate in regular exercise at present, Exercise limited by: orthopedic condition(s)(R shoulder pain)  Goals   None    Fall Risk Fall Risk  08/15/2019 08/15/2019 07/07/2019 04/03/2019 10/24/2017  Falls in the past year? 0 0 0 0 No  Number falls in past yr: 0 0 - - -  Injury with Fall? 0 0 - - -  Risk for fall due to : No Fall Risks -  - - -  Follow up Falls evaluation completed - - - -   Is the patient's home free of loose throw rugs in walkways, pet beds, electrical cords, etc?   yes  Timed Get Up and Go Performed: <5 seconds  Depression Screen PHQ 2/9 Scores 08/15/2019 10/22/2018 04/02/2018 10/24/2017  PHQ - 2 Score 0 0 0 0  PHQ- 9 Score - 3 - -   Cognitive Function MMSE - Mini Mental State Exam 08/15/2019 10/24/2017  Orientation to time 5 4  Orientation to time comments - Patient misidentified year - Gave 2020  Orientation to Place 3 5  Registration 3 3  Attention/ Calculation 5 3  Attention/Calculation-comments - Was able to count backwards from 100 by 5s but not by 7. Additionally, not able to spelll "WORLD" forwards or backwards.  Recall 3 0  Recall-comments - Immediate recall intact. Delayed recall 0/3.  Language- name 2 objects 2 2  Language- repeat 1 1  Language- follow 3 step command 3 3  Language- read & follow direction 0 1  Write a sentence 0 0  Copy design 1 1  Total score 26 23   Immunization History  Administered Date(s) Administered  . Influenza,inj,Quad PF,6+ Mos 01/16/2018, 01/24/2019  . PFIZER SARS-COV-2 Vaccination 07/10/2019, 08/04/2019    Qualifies for Shingles Vaccine? Yes   Screening Tests Health Maintenance  Topic Date Due  . FOOT EXAM  Never done  . HEMOGLOBIN A1C  10/01/2018  . TETANUS/TDAP  12/31/2019 (Originally 10/13/1970)  . PNA vac Low Risk Adult (1 of 2 - PCV13) 12/31/2019 (Originally 10/12/2016)  . INFLUENZA VACCINE  10/26/2019  . COLONOSCOPY  11/23/2019  . OPHTHALMOLOGY EXAM  01/06/2020  . COVID-19 Vaccine  Completed  . Hepatitis C Screening  Completed   Cancer Screenings: Lung: Low Dose CT Chest recommended if Age 73-80 years, 30 pack-year currently smoking OR have quit w/in 15years. Patient does not qualify. Colorectal: up to date  Additional Screenings:  Hepatitis C Screening: completed  A1c: due   Plan:  This is a routine wellness examination for Benjamin Nunez.    Acute R shoulder pain DG shoulder R ordered. Pt encouraged to take ibuprofen OTC prn to see if this offers relief. BP and renal function okay for this but he was encouraged to limit use to prn only. Will message PCP.  HRA/Patient Safety Patient with no fall history. No fall risk identified. Depression screening negative with 0/2 PHQ-2 score. No additional depressive symptoms identified. MMSE 26/30.   Social/Medical/Family History Updated histories in [facility]. Patient denies ever smoking. Reports being socially active/engaged with family.  Exercise Activities and Dietary Recommendations BMI of 27.43. Counseling given concerning healthy diet and 150 mins/week of moderate exercise.  Current Exercise Habits: Limited  d/t R shoulder pain.  Functional Ability Patient independent with ADLs. Denies visual or hearing impairment. Denies urinary/fecal incontinence. He does wear glasses. States that he has seen Dr. Gershon Crane within the last 6 months.    Medication Review Medication regimen reviewed with patient. Reports compliance with daily/scheduled medications. He does not take aspirin or cholesterol-lowering medication.  No clinical ASCVD. Last LDL 77. No diabetes or other high-risk conditions with the exception of high blood pressure. TC is outside of range to calculate ASCVD risk score. No indication for statins or ASA at this time.   Screening/Vaccines  Patient is due PNA, shingles, and tetanus vaccines. When I offered these, pt declined. He has received his covid vaccines. Additionally, he is due for a foot exam and A1c screening. He is up-to-date on his colonoscopy screening. I have ordered an A1c today.  -A1c  I have personally reviewed and noted the following in the patient's chart:   . Medical and social history . Use of alcohol, tobacco or illicit drugs  . Current medications and supplements . Functional ability and status . Nutritional status . Physical activity . Advanced  directives . List of other physicians . Hospitalizations, surgeries, and ER visits in previous 12 months . Vitals . Screenings to include cognitive, depression, and falls . Referrals and appointments  In addition, I have reviewed and discussed with patient certain preventive protocols, quality metrics, and best practice recommendations. A written personalized care plan for preventive services as well as general preventive health recommendations were provided to patient.     Tresa Endo, RPH-CPP  08/15/2019

## 2019-08-16 LAB — HEMOGLOBIN A1C
Est. average glucose Bld gHb Est-mCnc: 111 mg/dL
Hgb A1c MFr Bld: 5.5 % (ref 4.8–5.6)

## 2019-08-16 NOTE — Progress Notes (Signed)
Let patient know that the x-ray of his right shoulder revealed changes consistent with osteoarthritis.  I can refer him to orthopedics for injection to the shoulder if he is finding no relief with over-the-counter anti-inflammatory medication.  Let me know if he wants to be referred.

## 2019-08-18 ENCOUNTER — Telehealth: Payer: Self-pay

## 2019-08-18 MED ORDER — TRAMADOL HCL 50 MG PO TABS: 50.0000 mg | ORAL_TABLET | Freq: Two times a day (BID) | ORAL | 0 refills | Status: DC | PRN

## 2019-08-18 NOTE — Telephone Encounter (Signed)
Contacted pt to go over xray results   Dr. Wynetta Emery pt states he is not wanting no injections in the shoulder. Pt states he will find a medication to help. Pt is wanting to know if Dr. Wynetta Emery recommend a medication to take

## 2019-08-19 MED FILL — traMADol HCL 50 MG TABS: 50 | 7 days supply | Qty: 14 | Fill #0

## 2019-08-19 NOTE — Telephone Encounter (Signed)
Contacted pt and went over Dr. Johnson response pt is aware and doesn't have any questions or concerns 

## 2019-08-21 ENCOUNTER — Ambulatory Visit: Payer: Medicare Other | Admitting: Internal Medicine

## 2019-09-19 ENCOUNTER — Other Ambulatory Visit: Payer: Self-pay

## 2019-09-19 ENCOUNTER — Ambulatory Visit: Payer: Medicare Other | Attending: Internal Medicine | Admitting: Internal Medicine

## 2019-09-19 DIAGNOSIS — I10 Essential (primary) hypertension: Secondary | ICD-10-CM

## 2019-09-19 DIAGNOSIS — M19011 Primary osteoarthritis, right shoulder: Secondary | ICD-10-CM | POA: Diagnosis not present

## 2019-09-19 NOTE — Progress Notes (Signed)
Pt states the medicine he received for his shoulder has helped a lot

## 2019-09-19 NOTE — Progress Notes (Signed)
Virtual Visit via Telephone Note Due to current restrictions/limitations of in-office visits due to the COVID-19 pandemic, this scheduled clinical appointment was converted to a telehealth visit  I connected with Benjamin Nunez on 09/19/19 at 12:22 p.m by telephone and verified that I am speaking with the correct person using two identifiers. I am in my office.  The patient is at home.  Only the patient and myself participated in this encounter.  I discussed the limitations, risks, security and privacy concerns of performing an evaluation and management service by telephone and the availability of in person appointments. I also discussed with the patient that there may be a patient responsible charge related to this service. The patient expressed understanding and agreed to proceed.   History of Present Illness: HTN, adenomatous colon polyps. Patient was last seen by me 06/2019.  Pt c/o pain in RT shoulder when he saw clinical pharmacist. We ordered an x-ray of the shoulder and this revealed some arthritis changes.  I prescribed some tramadol for him to take as needed.  Patient declined orthopedic referral.  Today he states that his shoulder is doing much better.  He is able to use the joint.  He sometimes still gets a flareup of pain in the left shoulder.  He takes the tramadol about every other day -Denies any previous injury to the right shoulder.    HTN:  Checks BP QOD.  Last reading was 130/74.  Compliant with taking medications. Limits salt in foods No CP or SOB  Outpatient Encounter Medications as of 09/19/2019  Medication Sig Note  . amLODipine (NORVASC) 10 MG tablet Take 1 tablet (10 mg total) by mouth daily.   . diclofenac sodium (VOLTAREN) 1 % GEL Apply 2 g topically 4 (four) times daily. 08/15/2019: Using for R shoulder pain with no improvement.   Marland Kitchen lisinopril (ZESTRIL) 10 MG tablet Take 1.5 tablets (15 mg total) by mouth daily.   . traMADol (ULTRAM) 50 MG tablet Take 1 tablet (50  mg total) by mouth every 12 (twelve) hours as needed.    No facility-administered encounter medications on file as of 09/19/2019.      Observations/Objective: No direct observation done as this was a telephone encounter  Assessment and Plan: 1. Essential hypertension -Home blood pressure is at goal.  Continue amlodipine and lisinopril.  2. Primary osteoarthritis of right shoulder Discussed diagnosis of osteoarthritis.  I told patient that sometime he can progress to the extent that people will need joint replacement but for now he seems to be doing okay.  He will continue the tramadol as needed.   Follow Up Instructions: 4 mths   I discussed the assessment and treatment plan with the patient. The patient was provided an opportunity to ask questions and all were answered. The patient agreed with the plan and demonstrated an understanding of the instructions.   The patient was advised to call back or seek an in-person evaluation if the symptoms worsen or if the condition fails to improve as anticipated.  I provided 7 minutes of non-face-to-face time during this encounter.   Karle Plumber, MD

## 2019-10-07 ENCOUNTER — Telehealth: Payer: Self-pay | Admitting: Internal Medicine

## 2019-10-07 NOTE — Telephone Encounter (Signed)
Returned pt call pt states he is wanting to speak with the Clinical Pharmacist Lurena Joiner

## 2019-10-07 NOTE — Telephone Encounter (Signed)
Please advise.  Copied from [place] [phone]. Topic: General - Other >> Oct 06, 2019  2:33 PM Hinda Lenis D wrote: Reason for CRM: PT needs to speak with DR Wynetta Emery medical assistance / personal matter / please advise

## 2019-10-08 NOTE — Telephone Encounter (Signed)
Call returned. No answer. I attempted to leave a VM but received an automated message stating that his VM has not been set up yet.

## 2019-11-13 MED FILL — LISINOPRIL 10 MG TABS: 10 | 90 days supply | Qty: 135 | Fill #2

## 2019-12-15 MED FILL — AMLODIPINE BESYLATE 10 MG T: 10 | 90 days supply | Qty: 90 | Fill #1

## 2020-01-16 ENCOUNTER — Ambulatory Visit (HOSPITAL_BASED_OUTPATIENT_CLINIC_OR_DEPARTMENT_OTHER): Payer: Medicare Other | Admitting: Pharmacist

## 2020-01-16 ENCOUNTER — Encounter: Payer: Self-pay | Admitting: Internal Medicine

## 2020-01-16 ENCOUNTER — Ambulatory Visit: Payer: Medicare Other | Attending: Internal Medicine | Admitting: Internal Medicine

## 2020-01-16 ENCOUNTER — Other Ambulatory Visit: Payer: Self-pay

## 2020-01-16 ENCOUNTER — Telehealth: Payer: Self-pay | Admitting: Internal Medicine

## 2020-01-16 VITALS — BP 136/70 | HR 73 | Resp 16 | Wt 182.8 lb

## 2020-01-16 DIAGNOSIS — Z23 Encounter for immunization: Secondary | ICD-10-CM

## 2020-01-16 DIAGNOSIS — Z2821 Immunization not carried out because of patient refusal: Secondary | ICD-10-CM

## 2020-01-16 DIAGNOSIS — D126 Benign neoplasm of colon, unspecified: Secondary | ICD-10-CM | POA: Diagnosis not present

## 2020-01-16 DIAGNOSIS — I10 Essential (primary) hypertension: Secondary | ICD-10-CM | POA: Diagnosis not present

## 2020-01-16 DIAGNOSIS — M19012 Primary osteoarthritis, left shoulder: Secondary | ICD-10-CM

## 2020-01-16 MED ORDER — TRAMADOL HCL 50 MG PO TABS: 50.0000 mg | ORAL_TABLET | Freq: Every day | ORAL | 0 refills | Status: DC | PRN

## 2020-01-16 MED FILL — traMADol HCL 50 MG TABS: 50 | 7 days supply | Qty: 7 | Fill #0
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
## 2020-01-16 NOTE — Telephone Encounter (Signed)
Will forward to pcp

## 2020-01-16 NOTE — Progress Notes (Signed)
Patient presents for vaccination against influenza per orders of Dr. Johnson. Consent given. Counseling provided. No contraindications exists. Vaccine administered without incident.  ° °Luke Van Ausdall, PharmD, CPP °Clinical Pharmacist °[facility] °[phone] ° °

## 2020-01-16 NOTE — Patient Instructions (Signed)
Influenza Virus Vaccine injection (Fluarix) What is this medicine? INFLUENZA VIRUS VACCINE (in floo EN zuh VAHY ruhs vak SEEN) helps to reduce the risk of getting influenza also known as the flu. This medicine may be used for other purposes; ask your health care provider or pharmacist if you have questions. COMMON BRAND NAME(S): Fluarix, Fluzone What should I tell my health care provider before I take this medicine? They need to know if you have any of these conditions:  bleeding disorder like hemophilia  fever or infection  Guillain-Barre syndrome or other neurological problems  immune system problems  infection with the human immunodeficiency virus (HIV) or AIDS  low blood platelet counts  multiple sclerosis  an unusual or allergic reaction to influenza virus vaccine, eggs, chicken proteins, latex, gentamicin, other medicines, foods, dyes or preservatives  pregnant or trying to get pregnant  breast-feeding How should I use this medicine? This vaccine is for injection into a muscle. It is given by a health care professional. A copy of Vaccine Information Statements will be given before each vaccination. Read this sheet carefully each time. The sheet may change frequently. Talk to your pediatrician regarding the use of this medicine in children. Special care may be needed. Overdosage: If you think you have taken too much of this medicine contact a poison control center or emergency room at once. NOTE: This medicine is only for you. Do not share this medicine with others. What if I miss a dose? This does not apply. What may interact with this medicine?  chemotherapy or radiation therapy  medicines that lower your immune system like etanercept, anakinra, infliximab, and adalimumab  medicines that treat or prevent blood clots like warfarin  phenytoin  steroid medicines like prednisone or cortisone  theophylline  vaccines This list may not describe all possible  interactions. Give your health care provider a list of all the medicines, herbs, non-prescription drugs, or dietary supplements you use. Also tell them if you smoke, drink alcohol, or use illegal drugs. Some items may interact with your medicine. What should I watch for while using this medicine? Report any side effects that do not go away within 3 days to your doctor or health care professional. Call your health care provider if any unusual symptoms occur within 6 weeks of receiving this vaccine. You may still catch the flu, but the illness is not usually as bad. You cannot get the flu from the vaccine. The vaccine will not protect against colds or other illnesses that may cause fever. The vaccine is needed every year. What side effects may I notice from receiving this medicine? Side effects that you should report to your doctor or health care professional as soon as possible:  allergic reactions like skin rash, itching or hives, swelling of the face, lips, or tongue Side effects that usually do not require medical attention (report to your doctor or health care professional if they continue or are bothersome):  fever  headache  muscle aches and pains  pain, tenderness, redness, or swelling at site where injected  weak or tired This list may not describe all possible side effects. Call your doctor for medical advice about side effects. You may report side effects to FDA at 1-800-FDA-1088. Where should I keep my medicine? This vaccine is only given in a clinic, pharmacy, doctor's office, or other health care setting and will not be stored at home. NOTE: This sheet is a summary. It may not cover all possible information. If you have questions   about this medicine, talk to your doctor, pharmacist, or health care provider.  2020 Elsevier/Gold Standard (2007-10-09 09:30:40)  

## 2020-01-16 NOTE — Telephone Encounter (Signed)
Copied from [place] [id number]. Topic: Quick Communication - Office Called Patient (Clinic Use ONLY) >> Jan 16, 2020 12:52 PM Lennox Solders wrote: Reason for CRM: Pt stated the dr Wynetta Emery called him and he is returning the call. Pt did not elaborate the reason

## 2020-01-19 ENCOUNTER — Ambulatory Visit: Payer: Medicare Other | Attending: Internal Medicine | Admitting: Internal Medicine

## 2020-01-19 ENCOUNTER — Other Ambulatory Visit: Payer: Self-pay

## 2020-01-19 DIAGNOSIS — N529 Male erectile dysfunction, unspecified: Secondary | ICD-10-CM | POA: Diagnosis not present

## 2020-01-19 MED ORDER — SILDENAFIL CITRATE 50 MG PO TABS: ORAL_TABLET | ORAL | 3 refills | Status: DC

## 2020-01-19 NOTE — Progress Notes (Signed)
Virtual Visit via Telephone Note Due to current restrictions/limitations of in-office visits due to the COVID-19 pandemic, this scheduled clinical appointment was converted to a telehealth visit  I connected with Benjamin Nunez on 01/19/20 at 11:57 a.m by telephone and verified that I am speaking with the correct person using two identifiers.  Location: Patient: home Provider: office at [facility]   I discussed the limitations, risks, security and privacy concerns of performing an evaluation and management service by telephone and the availability of in person appointments. I also discussed with the patient that there may be a patient responsible charge related to this service. The patient expressed understanding and agreed to proceed.   History of Present Illness: HTN, adenomatous colon polyps, OA shoulders   Pt complains of getting an erection for several mths.  Not in new relationshipWould like to try Viagra. Sexual desire is good.  Observations/Objective: No direct observations  Assessment and Plan: 1. Erectile dysfunction, unspecified erectile dysfunction type Patient wanting to give a trial of Viagra.  I think this is reasonable. Pt advised of possible side effects of this medication including prolong erection, flushing, headaches, stuff nose and sudden vision and hearing changes.  Pt told to be seen in ER if he has erection lasting longer than 3-4 hrs, or if he has sudden vision changes or hearing loss. - sildenafil (VIAGRA) 50 MG tablet; 1 tab PO 1/2 hr to 1 hr prior to sex PRN.  Limit to 1 tab/24 hrs  Dispense: 10 tablet; Refill: 3   Follow Up Instructions: PRN   I discussed the assessment and treatment plan with the patient. The patient was provided an opportunity to ask questions and all were answered. The patient agreed with the plan and demonstrated an understanding of the instructions.   The patient was advised to call back or seek an in-person evaluation if the symptoms  worsen or if the condition fails to improve as anticipated.  I provided 8 minutes of non-face-to-face time during this encounter.   Karle Plumber, MD

## 2020-01-19 NOTE — Telephone Encounter (Signed)
Added pt to Dr. Wynetta Emery schedule for tele for 10/25

## 2020-02-16 ENCOUNTER — Other Ambulatory Visit: Payer: Self-pay | Admitting: Internal Medicine

## 2020-02-16 DIAGNOSIS — I10 Essential (primary) hypertension: Secondary | ICD-10-CM

## 2020-02-16 MED FILL — LISINOPRIL 10 MG TABS: 10 | 90 days supply | Qty: 135 | Fill #0

## 2020-02-16 NOTE — Telephone Encounter (Signed)
Requested Prescriptions  Pending Prescriptions Disp Refills  . lisinopril (ZESTRIL) 10 MG tablet [Pharmacy Med Name: LISINOPRIL 10 MG TABS 10 Tablet] 135 tablet 0    Sig: TAKE 1 & 1/2 TABLETS (15 MG TOTAL) BY MOUTH DAILY.     Cardiovascular:  ACE Inhibitors Failed - 02/16/2020 11:44 AM      Failed - Cr in normal range and within 180 days    Creatinine, Ser  Date Value Ref Range Status  04/07/2019 0.99 0.76 - 1.27 mg/dL Final         Failed - K in normal range and within 180 days    Potassium  Date Value Ref Range Status  04/07/2019 4.1 3.5 - 5.2 mmol/L Final         Passed - Patient is not pregnant      Passed - Last BP in normal range    BP Readings from Last 1 Encounters:  01/16/20 136/70         Passed - Valid encounter within last 6 months    Recent Outpatient Visits          4 weeks ago Erectile dysfunction, unspecified erectile dysfunction type   [place], MD   1 month ago Need for influenza vaccination   [place], Jarome Matin, RPH-CPP   1 month ago Essential hypertension   [place], MD   5 months ago Essential hypertension   [place], MD   7 months ago Essential hypertension   [place], MD      Future Appointments            In 3 months Wynetta Emery, Dalbert Batman, MD [place]

## 2020-03-15 ENCOUNTER — Other Ambulatory Visit: Payer: Self-pay | Admitting: Internal Medicine

## 2020-03-15 DIAGNOSIS — I10 Essential (primary) hypertension: Secondary | ICD-10-CM

## 2020-03-15 MED FILL — AMLODIPINE BESYLATE 10 MG T: 10 | 90 days supply | Qty: 90 | Fill #0

## 2020-04-08 ENCOUNTER — Other Ambulatory Visit: Payer: Self-pay

## 2020-04-08 ENCOUNTER — Ambulatory Visit (AMBULATORY_SURGERY_CENTER): Payer: Self-pay | Admitting: *Deleted

## 2020-04-08 ENCOUNTER — Other Ambulatory Visit: Payer: Self-pay | Admitting: Gastroenterology

## 2020-04-08 VITALS — Ht 67.0 in | Wt 178.0 lb

## 2020-04-08 DIAGNOSIS — Z86010 Personal history of colonic polyps: Secondary | ICD-10-CM

## 2020-04-08 MED ORDER — SUPREP BOWEL PREP KIT 17.5-3.13-1.6 GM/177ML PO SOLN: 1.0000 | Freq: Once | ORAL | 0 refills | Status: DC

## 2020-04-08 NOTE — Progress Notes (Signed)
No egg or soy allergy known to patient  No issues with past sedation with any surgeries or procedures No intubation problems in the past  No FH of Malignant Hyperthermia No diet pills per patient No home 02 use per patient  No blood thinners per patient  Pt denies issues with constipation but last colon prep adequate with increased lavage and suctioning - 2 day suprep per SA-  No A fib or A flutter  EMMI video to pt or via [place] 19 guidelines implemented in PV today with Pt and RN  Pt is fully vaccinated  for Covid    Due to the COVID-19 pandemic we are asking patients to follow certain guidelines.  Pt aware of COVID protocols and [facility] guidelines

## 2020-04-09 ENCOUNTER — Telehealth: Payer: Self-pay | Admitting: *Deleted

## 2020-04-09 MED FILL — SUPREP BOWEL PREP KIT: 17.5-3.13-1 | 30 days supply | Qty: 354 | Fill #0

## 2020-04-09 NOTE — Telephone Encounter (Signed)
Patient's pharmacy called, they state the Suprep is $117 with insurance. Offered the [place]. Then I called the patient and explained the difference and the patient wants to stay with the [place]. Patient aware of the cost. Went over what he will need OTC also wit the patient.

## 2020-04-16 ENCOUNTER — Encounter: Payer: Self-pay | Admitting: Gastroenterology

## 2020-04-22 ENCOUNTER — Ambulatory Visit (AMBULATORY_SURGERY_CENTER): Payer: Medicare Other | Admitting: Gastroenterology

## 2020-04-22 ENCOUNTER — Encounter: Payer: Self-pay | Admitting: Gastroenterology

## 2020-04-22 ENCOUNTER — Other Ambulatory Visit: Payer: Self-pay

## 2020-04-22 VITALS — BP 133/74 | HR 63 | Temp 97.5°F | Resp 15 | Ht 67.0 in | Wt 178.0 lb

## 2020-04-22 DIAGNOSIS — D123 Benign neoplasm of transverse colon: Secondary | ICD-10-CM

## 2020-04-22 DIAGNOSIS — Z86010 Personal history of colonic polyps: Secondary | ICD-10-CM

## 2020-04-22 MED ORDER — SODIUM CHLORIDE 0.9 % IV SOLN: 500.0000 mL | Freq: Once | INTRAVENOUS | Status: DC

## 2020-04-22 NOTE — Patient Instructions (Signed)
Handout given for polyps.  YOU HAD AN ENDOSCOPIC PROCEDURE TODAY AT THE [facility]:   Refer to the procedure report that was given to you for any specific questions about what was found during the examination.  If the procedure report does not answer your questions, please call your gastroenterologist to clarify.  If you requested that your care partner not be given the details of your procedure findings, then the procedure report has been included in a sealed envelope for you to review at your convenience later.  YOU SHOULD EXPECT: Some feelings of bloating in the abdomen. Passage of more gas than usual.  Walking can help get rid of the air that was put into your GI tract during the procedure and reduce the bloating. If you had a lower endoscopy (such as a colonoscopy or flexible sigmoidoscopy) you may notice spotting of blood in your stool or on the toilet paper. If you underwent a bowel prep for your procedure, you may not have a normal bowel movement for a few days.  Please Note:  You might notice some irritation and congestion in your nose or some drainage.  This is from the oxygen used during your procedure.  There is no need for concern and it should clear up in a day or so.  SYMPTOMS TO REPORT IMMEDIATELY:   Following lower endoscopy (colonoscopy or flexible sigmoidoscopy):  Excessive amounts of blood in the stool  Significant tenderness or worsening of abdominal pains  Swelling of the abdomen that is new, acute  Fever of 100F or higher  For urgent or emergent issues, a gastroenterologist can be reached at any hour by calling [phone]. Do not use MyChart messaging for urgent concerns.    DIET:  We do recommend a small meal at first, but then you may proceed to your regular diet.  Drink plenty of fluids but you should avoid alcoholic beverages for 24 hours.  ACTIVITY:  You should plan to take it easy for the rest of today and you should NOT DRIVE or use heavy  machinery until tomorrow (because of the sedation medicines used during the test).    FOLLOW UP: Our staff will call the number listed on your records 48-72 hours following your procedure to check on you and address any questions or concerns that you may have regarding the information given to you following your procedure. If we do not reach you, we will leave a message.  We will attempt to reach you two times.  During this call, we will ask if you have developed any symptoms of COVID 19. If you develop any symptoms (ie: fever, flu-like symptoms, shortness of breath, cough etc.) before then, please call [phone].  If you test positive for Covid 19 in the 2 weeks post procedure, please call and report this information to us.    If any biopsies were taken you will be contacted by phone or by letter within the next 1-3 weeks.  Please call us at [phone] if you have not heard about the biopsies in 3 weeks.    SIGNATURES/CONFIDENTIALITY: You and/or your care partner have signed paperwork which will be entered into your electronic medical record.  These signatures attest to the fact that that the information above on your After Visit Summary has been reviewed and is understood.  Full responsibility of the confidentiality of this discharge information lies with you and/or your care-partner. 

## 2020-04-22 NOTE — Op Note (Signed)
[place] Patient Name: Benjamin Nunez Procedure Date: 04/22/2020 11:04 AM MRN: [id number] Endoscopist: Remo Lipps P. Havery Moros , MD Age: 70 Referring MD:  Date of Birth: [birth year] Gender: Male Account #: [id number] Procedure:                Colonoscopy Indications:              High risk colon cancer surveillance: Personal                            history of colonic polyps (3 adenomas removed in                            10/2016) Medicines:                Monitored Anesthesia Care Procedure:                Pre-Anesthesia Assessment:                           - Prior to the procedure, a History and Physical                            was performed, and patient medications and                            allergies were reviewed. The patient's tolerance of                            previous anesthesia was also reviewed. The risks                            and benefits of the procedure and the sedation                            options and risks were discussed with the patient.                            All questions were answered, and informed consent                            was obtained. Prior Anticoagulants: The patient has                            taken no previous anticoagulant or antiplatelet                            agents. ASA Grade Assessment: II - A patient with                            mild systemic disease. After reviewing the risks                            and benefits, the patient was deemed in  satisfactory condition to undergo the procedure.                           After obtaining informed consent, the colonoscope                            was passed under direct vision. Throughout the                            procedure, the patient's blood pressure, pulse, and                            oxygen saturations were monitored continuously. The                            Olympus [id number] was  introduced                            through the anus and advanced to the the cecum,                            identified by appendiceal orifice and ileocecal                            valve. The colonoscopy was performed without                            difficulty. The patient tolerated the procedure                            well. The quality of the bowel preparation was                            good. The ileocecal valve, appendiceal orifice, and                            rectum were photographed. Scope In: 11:11:44 AM Scope Out: 11:32:36 AM Scope Withdrawal Time: 0 hours 18 minutes 22 seconds  Total Procedure Duration: 0 hours 20 minutes 52 seconds  Findings:                 The perianal and digital rectal examinations were                            normal.                           Three sessile polyps were found in the hepatic                            flexure. The polyps were diminutive in size. These                            polyps were removed with a cold snare. Resection  and retrieval were complete.                           Internal hemorrhoids were found during retroflexion.                           The exam was otherwise without abnormality. Complications:            No immediate complications. Estimated blood loss:                            Minimal. Estimated Blood Loss:     Estimated blood loss was minimal. Impression:               - Three diminutive polyps at the hepatic flexure,                            removed with a cold snare. Resected and retrieved.                           - Internal hemorrhoids.                           - The examination was otherwise normal. Recommendation:           - Patient has a contact number available for                            emergencies. The signs and symptoms of potential                            delayed complications were discussed with the                            patient. Return  to normal activities tomorrow.                            Written discharge instructions were provided to the                            patient.                           - Resume previous diet.                           - Continue present medications.                           - Await pathology results. Remo Lipps P. , MD 04/22/2020 11:36:39 AM This report has been signed electronically.

## 2020-04-22 NOTE — Progress Notes (Signed)
Called to room to assist during endoscopic procedure.  Patient ID and intended procedure confirmed with present staff. Received instructions for my participation in the procedure from the performing physician.  

## 2020-04-22 NOTE — Progress Notes (Signed)
pt tolerated well. VSS. awake and to recovery. Report given to RN.  

## 2020-04-22 NOTE — Progress Notes (Signed)
VS-CW  Pt's states no medical or surgical changes since previsit or office visit.  

## 2020-04-26 ENCOUNTER — Telehealth: Payer: Self-pay

## 2020-04-26 NOTE — Telephone Encounter (Signed)
Covid-19 screening questions   Do you now or have you had a fever in the last 14 days? No. Do you have any respiratory symptoms of shortness of breath or cough now or in the last 14 days? No.  Do you have any family members or close contacts with diagnosed or suspected Covid-19 in the past 14 days? No.  Have you been tested for Covid-19 and found to be positive? No.       Follow up Call-  Call back number 04/22/2020  Post procedure Call Back phone  # [phone]  Permission to leave phone message Yes  Some recent data might be hidden     Patient questions:  Do you have a fever, pain , or abdominal swelling? No. Pain Score  0 *  Have you tolerated food without any problems? Yes.    Have you been able to return to your normal activities? Yes.    Do you have any questions about your discharge instructions: Diet   No. Medications  No. Follow up visit  No.  Do you have questions or concerns about your Care? No.  Spoke to pt.'s wife on this call.  Actions: * If pain score is 4 or above: No action needed, pain <4.

## 2020-05-18 ENCOUNTER — Other Ambulatory Visit: Payer: Self-pay

## 2020-05-18 ENCOUNTER — Ambulatory Visit: Payer: Medicare Other | Attending: Internal Medicine | Admitting: Internal Medicine

## 2020-05-18 ENCOUNTER — Other Ambulatory Visit: Payer: Self-pay | Admitting: Internal Medicine

## 2020-05-18 ENCOUNTER — Encounter: Payer: Self-pay | Admitting: Internal Medicine

## 2020-05-18 VITALS — BP 128/70 | HR 66 | Resp 16 | Wt 180.0 lb

## 2020-05-18 DIAGNOSIS — Z23 Encounter for immunization: Secondary | ICD-10-CM | POA: Diagnosis not present

## 2020-05-18 DIAGNOSIS — I10 Essential (primary) hypertension: Secondary | ICD-10-CM | POA: Diagnosis not present

## 2020-05-18 DIAGNOSIS — N529 Male erectile dysfunction, unspecified: Secondary | ICD-10-CM | POA: Diagnosis not present

## 2020-05-18 DIAGNOSIS — M19012 Primary osteoarthritis, left shoulder: Secondary | ICD-10-CM | POA: Diagnosis not present

## 2020-05-18 DIAGNOSIS — Z125 Encounter for screening for malignant neoplasm of prostate: Secondary | ICD-10-CM

## 2020-05-18 DIAGNOSIS — E663 Overweight: Secondary | ICD-10-CM | POA: Diagnosis not present

## 2020-05-18 MED ORDER — DICLOFENAC SODIUM 1 % EX GEL: 2.0000 g | Freq: Four times a day (QID) | CUTANEOUS | 6 refills | Status: DC

## 2020-05-18 NOTE — Patient Instructions (Signed)
Call  [phone] to schedule appointment to get COVID-19 vaccine.

## 2020-05-18 NOTE — Progress Notes (Signed)
Patient ID: Benjamin Nunez, male    DOB: 02/08/1951  MRN: [id number]  CC: Hypertension   Subjective: Benjamin Nunez is a 70 y.o. male who presents for chronic ds management His concerns today include:  HTN, adenomatous colon polyps, OA shoulders, ED  ED: did well with Viagra.  OA shoulders: Left shoulder bothers him much more than the right.  He is requesting refill on Voltaren gel.  He reports good results with it but has been out of it for about 1 month.  Shoulder bothers him in cold weather and when he uses the shoulder too much.  I had recommended referral to orthopedics in the past but patient wanting to hold off on that stating that he does not want to be cut on.  HTN:  Did not take meds as yet for the morning.  Limits salt in foods.  Has device at home to check BP.  Last checked 4 days ago.  Does not recall last number but states it has been running good.  No CP/SOB/LE edema.    Over wgh:  Does a lot of walking  HM: Due for [employer] booster  Patient Active Problem List   Diagnosis Date Noted  . Primary osteoarthritis of left shoulder 01/16/2020  . Acute pain of left shoulder 10/22/2018  . Over weight 04/02/2018  . Adenomatous polyp of colon 03/02/2017  . Essential hypertension 09/18/2016     Current Outpatient Medications on File Prior to Visit  Medication Sig Dispense Refill  . amLODipine (NORVASC) 10 MG tablet TAKE 1 TABLET (10 MG TOTAL) BY MOUTH DAILY. 90 tablet 1  . diclofenac sodium (VOLTAREN) 1 % GEL Apply 2 g topically 4 (four) times daily. 100 g 1  . lisinopril (ZESTRIL) 10 MG tablet TAKE 1 & 1/2 TABLETS (15 MG TOTAL) BY MOUTH DAILY. 135 tablet 1  . sildenafil (VIAGRA) 50 MG tablet 1 tab PO 1/2 hr to 1 hr prior to sex PRN.  Limit to 1 tab/24 hrs 10 tablet 3  . traMADol (ULTRAM) 50 MG tablet Take 1 tablet (50 mg total) by mouth daily as needed for moderate pain. 30 tablet 0   No current facility-administered medications on file prior to visit.    No Known  Allergies  Social History   Socioeconomic History  . Marital status: Married    Spouse name: Not on file  . Number of children: Not on file  . Years of education: Not on file  . Highest education level: 9th grade  Occupational History  . Not on file  Tobacco Use  . Smoking status: Never Smoker  . Smokeless tobacco: Never Used  Vaping Use  . Vaping Use: Never used  Substance and Sexual Activity  . Alcohol use: Yes    Comment: ocasionally  . Drug use: No  . Sexual activity: Not on file  Other Topics Concern  . Not on file  Social History Narrative  . Not on file   Social Determinants of Health   Financial Resource Strain: Not on file  Food Insecurity: Not on file  Transportation Needs: Not on file  Physical Activity: Not on file  Stress: Not on file  Social Connections: Not on file  Intimate Partner Violence: Not on file    Family History  Problem Relation Age of Onset  . Diabetes Brother   . Hypertension Brother   . Asthma Neg Hx   . Cancer Neg Hx   . Heart failure Neg Hx   . Hyperlipidemia Neg  Hx   . Colon cancer Neg Hx   . Colon polyps Neg Hx   . Esophageal cancer Neg Hx   . Stomach cancer Neg Hx   . Rectal cancer Neg Hx     Past Surgical History:  Procedure Laterality Date  . COLONOSCOPY    . ORIF FEMUR FRACTURE  1970s  . POLYPECTOMY      ROS: Review of Systems Negative except as stated above  PHYSICAL EXAM: BP (!) 148/79   Pulse 66   Resp 16   Wt 180 lb (81.6 kg)   SpO2 97%   BMI 28.19 kg/m   Wt Readings from Last 3 Encounters:  05/18/20 180 lb (81.6 kg)  04/22/20 178 lb (80.7 kg)  04/08/20 178 lb (80.7 kg)    Physical Exam  General appearance - alert, well appearing, and in no distress Mental status - normal mood, behavior, speech, dress, motor activity, and thought processes Neck - supple, no significant adenopathy Chest - clear to auscultation, no wheezes, rales or rhonchi, symmetric air entry Heart - normal rate, regular  rhythm, normal S1, S2, no murmurs, rubs, clicks or gallops Musculoskeletal -left shoulder joint: No point tenderness.  He has mild crepitus felt and heard on passive range of motion.  He has mild discomfort on passive range of motion in all directions.  Extremities - peripheral pulses normal, no pedal edema, no clubbing or cyanosis  CMP Latest Ref Rng & Units 04/07/2019 07/31/2017 09/18/2016  Glucose 65 - 99 mg/dL 84 87 84  BUN 8 - 27 mg/dL 11 8 9   Creatinine 0.76 - 1.27 mg/dL 0.99 0.92 0.96  Sodium 134 - 144 mmol/L 140 141 146(H)  Potassium 3.5 - 5.2 mmol/L 4.1 4.2 3.9  Chloride 96 - 106 mmol/L 103 104 107(H)  CO2 20 - 29 mmol/L 23 23 24   Calcium 8.6 - 10.2 mg/dL 9.6 9.4 9.6  Total Protein 6.0 - 8.5 g/dL 7.2 7.4 7.4  Total Bilirubin 0.0 - 1.2 mg/dL 0.7 0.5 0.4  Alkaline Phos 39 - 117 IU/L 70 58 57  AST 0 - 40 IU/L 14 15 12   ALT 0 - 44 IU/L 14 14 12    Lipid Panel     Component Value Date/Time   CHOL 123 04/07/2019 1050   TRIG 43 04/07/2019 1050   HDL 36 (L) 04/07/2019 1050   CHOLHDL 3.4 04/07/2019 1050   LDLCALC 77 04/07/2019 1050    CBC    Component Value Date/Time   WBC 3.7 04/07/2019 1050   RBC 4.83 04/07/2019 1050   HGB 13.9 04/07/2019 1050   HCT 40.6 04/07/2019 1050   PLT 189 04/07/2019 1050   MCV 84 04/07/2019 1050   MCH 28.8 04/07/2019 1050   MCHC 34.2 04/07/2019 1050   RDW 13.3 04/07/2019 1050    ASSESSMENT AND PLAN: 1. Essential hypertension At goal.  Continue current medications and low-salt diet. - CBC - Comprehensive metabolic panel - Lipid panel  2. Primary osteoarthritis of left shoulder Refill given on Voltaren gel.  Advised patient that sometime in the future he would likely have to see orthopedics as the arthritis gets worse. - diclofenac Sodium (VOLTAREN) 1 % GEL; Apply 2 g topically 4 (four) times daily.  Dispense: 100 g; Refill: 6  3. Erectile dysfunction, unspecified erectile dysfunction type Continue sildenafil as needed  4. Over  weight Discussed and encourage healthy eating habits.  Continue to stay active.  5. Prostate cancer screening Discussed prostate cancer screening with PSA.  Patient willing  to be screened. - PSA  6. COVID-19 vaccine series started Patient given the phone number for our COVID-19 vaccine clinic.  He will call and schedule an appointment to get his booster shot.    Patient was given the opportunity to ask questions.  Patient verbalized understanding of the plan and was able to repeat key elements of the plan.   No orders of the defined types were placed in this encounter.    Requested Prescriptions    No prescriptions requested or ordered in this encounter    No follow-ups on file.  Karle Plumber, MD, FACP

## 2020-05-19 ENCOUNTER — Encounter: Payer: Self-pay | Admitting: Internal Medicine

## 2020-05-19 DIAGNOSIS — R972 Elevated prostate specific antigen [PSA]: Secondary | ICD-10-CM | POA: Insufficient documentation

## 2020-05-19 LAB — CBC
Hematocrit: 41.3 % (ref 37.5–51.0)
Hemoglobin: 13.9 g/dL (ref 13.0–17.7)
MCH: 28.4 pg (ref 26.6–33.0)
MCHC: 33.7 g/dL (ref 31.5–35.7)
MCV: 85 fL (ref 79–97)
Platelets: 182 10*3/uL (ref 150–450)
RBC: 4.89 x10E6/uL (ref 4.14–5.80)
RDW: 13.7 % (ref 11.6–15.4)
WBC: 4 10*3/uL (ref 3.4–10.8)

## 2020-05-19 LAB — COMPREHENSIVE METABOLIC PANEL
ALT: 14 IU/L (ref 0–44)
AST: 17 IU/L (ref 0–40)
Albumin/Globulin Ratio: 1.6 (ref 1.2–2.2)
Albumin: 4.4 g/dL (ref 3.8–4.8)
Alkaline Phosphatase: 64 IU/L (ref 44–121)
BUN/Creatinine Ratio: 11 (ref 10–24)
BUN: 10 mg/dL (ref 8–27)
Bilirubin Total: 0.8 mg/dL (ref 0.0–1.2)
CO2: 22 mmol/L (ref 20–29)
Calcium: 9.4 mg/dL (ref 8.6–10.2)
Chloride: 103 mmol/L (ref 96–106)
Creatinine, Ser: 0.93 mg/dL (ref 0.76–1.27)
GFR calc Af Amer: 97 mL/min/{1.73_m2} (ref >59)
GFR calc non Af Amer: 84 mL/min/{1.73_m2} (ref >59)
Globulin, Total: 2.7 g/dL (ref 1.5–4.5)
Glucose: 89 mg/dL (ref 65–99)
Potassium: 4.1 mmol/L (ref 3.5–5.2)
Sodium: 140 mmol/L (ref 134–144)
Total Protein: 7.1 g/dL (ref 6.0–8.5)

## 2020-05-19 LAB — LIPID PANEL
Chol/HDL Ratio: 2.9 ratio (ref 0.0–5.0)
Cholesterol, Total: 145 mg/dL (ref 100–199)
HDL: 50 mg/dL (ref >39)
LDL Chol Calc (NIH): 83 mg/dL (ref 0–99)
Triglycerides: 55 mg/dL (ref 0–149)
VLDL Cholesterol Cal: 12 mg/dL (ref 5–40)

## 2020-05-19 LAB — PSA: Prostate Specific Ag, Serum: 4.8 ng/mL — ABNORMAL HIGH (ref 0.0–4.0)

## 2020-05-19 NOTE — Progress Notes (Signed)
Patient notes that his blood cell counts including red blood cell, white blood cell and platelet counts are normal.  Kidney and liver function tests normal.  Cholesterol level normal.  PSA which is the screening test for prostate cancer is mildly elevated.  We will recheck this level on his follow-up visit in June.  At that time if the level has stayed stable, we will continue to monitor.  If it increases further, we will need to refer him to urology.

## 2020-05-20 ENCOUNTER — Telehealth: Payer: Self-pay

## 2020-05-20 NOTE — Telephone Encounter (Signed)
Contacted pt to go over lab results pt is aware and doesn't have any questions or concerns 

## 2020-05-21 ENCOUNTER — Ambulatory Visit: Payer: Medicare Other | Attending: Internal Medicine

## 2020-05-21 ENCOUNTER — Other Ambulatory Visit: Payer: Self-pay

## 2020-05-21 DIAGNOSIS — Z23 Encounter for immunization: Secondary | ICD-10-CM

## 2020-05-21 NOTE — Progress Notes (Signed)
   Covid-19 Vaccination Clinic  Name:  Benjamin Nunez    MRN: [id number] DOB: 04/01/1950  05/21/2020  Mr. [name] was observed post Covid-19 immunization for 15 minutes without incident. He was provided with Vaccine Information Sheet and instruction to access the V-Safe system.   Mr. [name] was instructed to call 911 with any severe reactions post vaccine: Marland Kitchen Difficulty breathing  . Swelling of face and throat  . A fast heartbeat  . A bad rash all over body  . Dizziness and weakness   Immunizations Administered    Name Date Dose VIS Date Route   PFIZER Comrnaty(Gray TOP) Covid-19 Vaccine 05/21/2020  1:39 PM 0.3 mL 03/04/2020 Intramuscular   Manufacturer: [employer]   Lot: [id number]   NDC: [phone]

## 2020-06-08 ENCOUNTER — Other Ambulatory Visit: Payer: Self-pay

## 2020-06-08 ENCOUNTER — Ambulatory Visit: Payer: Medicare Other | Attending: Internal Medicine | Admitting: Pharmacist

## 2020-06-08 ENCOUNTER — Encounter: Payer: Self-pay | Admitting: Pharmacist

## 2020-06-08 DIAGNOSIS — Z0000 Encounter for general adult medical examination without abnormal findings: Secondary | ICD-10-CM

## 2020-06-08 DIAGNOSIS — Z23 Encounter for immunization: Secondary | ICD-10-CM

## 2020-06-08 NOTE — Progress Notes (Signed)
Subjective:   Benjamin Nunez is a 70 y.o. male who presents for Medicare Annual/Subsequent preventive examination.  Objective:    Today's Vitals   06/08/20 1512 06/08/20 1513  BP: 138/79   Pulse: 76   Temp: 98.6 F (37 C)   Weight: 180 lb 12.8 oz (82 kg)   Height: 5\' 8"  (1.727 m)   PainSc: 0-No pain 6    Body mass index is 27.49 kg/m.  Advanced Directives 06/08/2020 08/15/2019 10/24/2017 11/08/2016 09/18/2016  Does Patient Have a Medical Advance Directive? No No No No No  Would patient like information on creating a medical advance directive? No - Patient declined No - Patient declined No - Patient declined - No - Patient declined    Current Medications (verified) Outpatient Encounter Medications as of 06/08/2020  Medication Sig  . amLODipine (NORVASC) 10 MG tablet TAKE 1 TABLET (10 MG TOTAL) BY MOUTH DAILY.  Marland Kitchen diclofenac Sodium (VOLTAREN) 1 % GEL Apply 2 g topically 4 (four) times daily.  Marland Kitchen lisinopril (ZESTRIL) 10 MG tablet TAKE 1 & 1/2 TABLETS (15 MG TOTAL) BY MOUTH DAILY.  . sildenafil (VIAGRA) 50 MG tablet 1 tab PO 1/2 hr to 1 hr prior to sex PRN.  Limit to 1 tab/24 hrs  . traMADol (ULTRAM) 50 MG tablet Take 1 tablet (50 mg total) by mouth daily as needed for moderate pain.   No facility-administered encounter medications on file as of 06/08/2020.    Allergies (verified) Patient has no known allergies.   History: Past Medical History:  Diagnosis Date  . Hypertension   . Sickle cell trait [facility])    Past Surgical History:  Procedure Laterality Date  . COLONOSCOPY    . ORIF FEMUR FRACTURE  1970s  . POLYPECTOMY     Family History  Problem Relation Age of Onset  . Diabetes Brother   . Hypertension Brother   . Asthma Neg Hx   . Cancer Neg Hx   . Heart failure Neg Hx   . Hyperlipidemia Neg Hx   . Colon cancer Neg Hx   . Colon polyps Neg Hx   . Esophageal cancer Neg Hx   . Stomach cancer Neg Hx   . Rectal cancer Neg Hx    Social History   Socioeconomic  History  . Marital status: Married    Spouse name: Not on file  . Number of children: Not on file  . Years of education: Not on file  . Highest education level: 9th grade  Occupational History  . Not on file  Tobacco Use  . Smoking status: Never Smoker  . Smokeless tobacco: Never Used  Vaping Use  . Vaping Use: Never used  Substance and Sexual Activity  . Alcohol use: Yes    Comment: ocasionally  . Drug use: No  . Sexual activity: Not on file  Other Topics Concern  . Not on file  Social History Narrative  . Not on file   Social Determinants of Health   Financial Resource Strain: Low Risk   . Difficulty of Paying Living Expenses: Not very hard  Food Insecurity: No Food Insecurity  . Worried About Charity fundraiser in the Last Year: Never true  . Ran Out of Food in the Last Year: Never true  Transportation Needs: No Transportation Needs  . Lack of Transportation (Medical): No  . Lack of Transportation (Non-Medical): No  Physical Activity: Insufficiently Active  . Days of Exercise per Week: 3 days  . Minutes of  Exercise per Session: 30 min  Stress: No Stress Concern Present  . Feeling of Stress : Not at all  Social Connections: Socially Isolated  . Frequency of Communication with Friends and Family: Never  . Frequency of Social Gatherings with Friends and Family: Never  . Attends Religious Services: Never  . Active Member of Clubs or Organizations: No  . Attends Archivist Meetings: Never  . Marital Status: Married   Tobacco Counseling Counseling given: Not Answered  Clinical Intake:  Pain : 0-10 Pain Score: 6  Pain Type: Chronic pain Pain Location: Shoulder Pain Orientation: Left Pain Descriptors / Indicators: Aching Pain Onset: More than a month ago Pain Frequency: Constant Pain Relieving Factors: Voltaren gel Effect of Pain on Daily Activities: Limitation  Pain Relieving Factors: Voltaren gel  BMI - recorded: 27.49 Nutritional Status: BMI  25 -29 Overweight Nutritional Risks: None Diabetes: No  How often do you need to have someone help you when you read instructions, pamphlets, or other written materials from your doctor or pharmacy?: 3 - Sometimes   Diabetic? yes  Interpreter Needed?: No  Activities of Daily Living In your present state of health, do you have any difficulty performing the following activities: 06/08/2020 08/15/2019  Hearing? N N  Vision? N N  Difficulty concentrating or making decisions? N N  Walking or climbing stairs? N N  Dressing or bathing? N N  Doing errands, shopping? N N  Preparing Food and eating ? N N  Using the Toilet? N N  In the past six months, have you accidently leaked urine? N N  Do you have problems with loss of bowel control? N N  Managing your Medications? N N  Managing your Finances? N N  Housekeeping or managing your Housekeeping? N N  Some recent data might be hidden   Patient Care Team: Ladell Pier, MD as PCP - General (Internal Medicine)  Indicate any recent Medical Services you may have received from other than [facility] providers in the past year (date may be approximate).     Assessment:   This is a routine wellness examination for [place].  Hearing/Vision screen No exam data present  Dietary issues and exercise activities discussed: Current Exercise Habits: Home exercise routine, Type of exercise: walking, Time (Minutes): 30, Frequency (Times/Week): 3, Weekly Exercise (Minutes/Week): 90, Intensity: Mild  Goals   None    Depression Screen PHQ 2/9 Scores 06/08/2020 06/08/2020 05/18/2020 01/16/2020 08/15/2019 10/22/2018 04/02/2018  PHQ - 2 Score 0 0 0 3 0 0 0  PHQ- 9 Score - - - 3 - 3 -    Fall Risk Fall Risk  06/08/2020 05/18/2020 01/16/2020 08/15/2019 08/15/2019  Falls in the past year? 0 0 0 0 0  Number falls in past yr: 0 0 0 0 0  Injury with Fall? 0 0 0 0 0  Risk for fall due to : - - - No Fall Risks -  Follow up Falls evaluation completed;Education  provided;Falls prevention discussed - - Falls evaluation completed -    FALL RISK PREVENTION PERTAINING TO THE HOME:   Any stairs in or around the home? No  If so, are there any without handrails? No  Home free of loose throw rugs in walkways, pet beds, electrical cords, etc? Yes  Adequate lighting in your home to reduce risk of falls? Yes   ASSISTIVE DEVICES UTILIZED TO PREVENT FALLS:  Life alert? No  Use of a cane, walker or w/c? No  Grab bars in the  bathroom? No  Shower chair or bench in shower? No  Elevated toilet seat or a handicapped toilet? No   TIMED UP AND GO:  Was the test performed? Yes .  Length of time to ambulate 10 feet: <5 sec.   Gait steady and fast with assistive device  Cognitive Function: MMSE - Mini Mental State Exam 06/08/2020 08/15/2019 10/24/2017  Orientation to time 5 5 4   Orientation to time comments - - Patient misidentified year - Gave 2020  Orientation to Place 5 3 5   Registration 3 3 3   Attention/ Calculation 5 5 3   Attention/Calculation-comments - - Was able to count backwards from 100 by 5s but not by 7. Additionally, not able to spelll "WORLD" forwards or backwards.  Recall 1 3 0  Recall-comments - - Immediate recall intact. Delayed recall 0/3.  Language- name 2 objects 2 2 2   Language- repeat 1 1 1   Language- follow 3 step command 3 3 3   Language- read & follow direction 1 0 1  Write a sentence 1 0 0  Copy design 1 1 1   Total score 28 26 23         Immunizations Immunization History  Administered Date(s) Administered  . Influenza,inj,Quad PF,6+ Mos 01/16/2018, 01/24/2019, 01/16/2020  . PFIZER Comirnaty(Gray Top)Covid-19 Tri-Sucrose Vaccine 05/21/2020  . PFIZER(Purple Top)SARS-COV-2 Vaccination 07/10/2019, 08/04/2019    TDAP: due. Given today.   Flu Vaccine status: Up to date  Pneumococcal vaccine status: Declined,  Education has been provided regarding the importance of this vaccine but patient still declined. Advised may receive  this vaccine at local pharmacy or Health Dept. Aware to provide a copy of the vaccination record if obtained from local pharmacy or Health Dept. Verbalized acceptance and understanding.   Covid-19 vaccine status: Completed vaccines  Qualifies for Shingles Vaccine? Yes   Zostavax completed No   Shingrix Completed?: No.    Education has been provided regarding the importance of this vaccine. Patient has been advised to call insurance company to determine out of pocket expense if they have not yet received this vaccine. Advised may also receive vaccine at local pharmacy or Health Dept. Verbalized acceptance and understanding.  Screening Tests Health Maintenance  Topic Date Due  . FOOT EXAM  Never done  . OPHTHALMOLOGY EXAM  01/06/2020  . HEMOGLOBIN A1C  02/15/2020  . TETANUS/TDAP  01/15/2021 (Originally 10/13/1970)  . PNA vac Low Risk Adult (1 of 2 - PCV13) 01/15/2021 (Originally 10/12/2016)  . COLONOSCOPY (Pts 45-30yrs Insurance coverage will need to be confirmed)  04/23/2027  . INFLUENZA VACCINE  Completed  . COVID-19 Vaccine  Completed  . Hepatitis C Screening  Completed  . HPV VACCINES  Aged Out    Health Maintenance  Health Maintenance Due  Topic Date Due  . FOOT EXAM  Never done  . OPHTHALMOLOGY EXAM  01/06/2020  . HEMOGLOBIN A1C  02/15/2020    Colorectal cancer screening: Type of screening: Colonoscopy. Completed 2022. Repeat every 7 years  Additional Screening:  Hepatitis C Screening: does qualify; Completed 07/31/2017  Vision Screening: Recommended annual ophthalmology exams for early detection of glaucoma and other disorders of the eye. Is the patient up to date with their annual eye exam?  No  Who is the provider or what is the name of the office in which the patient attends annual eye exams? Dr. Gershon Crane - encouraged him to make an appointment.   Dental Screening: Recommended annual dental exams for proper oral hygiene  Community Resource Referral / Chronic Care  Management: CRR required this visit?  No   CCM required this visit?  No      Plan:     I have personally reviewed and noted the following in the patient's chart:   . Medical and social history . Use of alcohol, tobacco or illicit drugs  . Current medications and supplements . Functional ability and status . Nutritional status . Physical activity . Advanced directives . List of other physicians . Hospitalizations, surgeries, and ER visits in previous 12 months . Vitals . Screenings to include cognitive, depression, and falls . Referrals and appointments  In addition, I have reviewed and discussed with patient certain preventive protocols, quality metrics, and best practice recommendations. A written personalized care plan for preventive services as well as general preventive health recommendations were provided to patient.     Tresa Endo, RPH-CPP   06/08/2020

## 2020-07-05 ENCOUNTER — Other Ambulatory Visit: Payer: Self-pay

## 2020-07-05 MED FILL — Amlodipine Besylate Tab 10 MG (Base Equivalent): ORAL | 90 days supply | Qty: 90 | Fill #0 | Status: AC

## 2020-09-16 ENCOUNTER — Ambulatory Visit: Payer: Medicare Other | Attending: Internal Medicine | Admitting: Internal Medicine

## 2020-09-16 ENCOUNTER — Other Ambulatory Visit: Payer: Self-pay

## 2020-09-16 ENCOUNTER — Telehealth: Payer: Self-pay | Admitting: Internal Medicine

## 2020-09-16 DIAGNOSIS — I10 Essential (primary) hypertension: Secondary | ICD-10-CM

## 2020-09-16 DIAGNOSIS — R972 Elevated prostate specific antigen [PSA]: Secondary | ICD-10-CM

## 2020-09-16 MED ORDER — AMLODIPINE BESYLATE 10 MG PO TABS
ORAL_TABLET | Freq: Every day | ORAL | 1 refills | Status: DC
  Filled 2020-09-16: qty 90, 90d supply, fill #0
  Filled 2021-01-17: qty 90, 90d supply, fill #1

## 2020-09-16 MED ORDER — LISINOPRIL 10 MG PO TABS
ORAL_TABLET | ORAL | 1 refills | Status: DC
  Filled 2020-09-16: qty 135, 90d supply, fill #0
  Filled 2020-12-22: qty 135, 90d supply, fill #1

## 2020-09-16 NOTE — Progress Notes (Signed)
Patient ID: Benjamin Nunez, male   DOB: Sep 19, 1950, 70 y.o.   MRN: [id number]  Virtual Visit via Telephone Note  I connected with Benjamin Nunez on 09/16/2020 at 11:10 a.m by telephone and verified that I am speaking with the correct person using two identifiers  Location: Patient: home Provider: office  Participants: Myself Patient CMA: Ms. Benjamin Nunez interpreter:   I discussed the limitations, risks, security and privacy concerns of performing an evaluation and management service by telephone and the availability of in person appointments. I also discussed with the patient that there may be a patient responsible charge related to this service. The patient expressed understanding and agreed to proceed.   History of Present Illness: HTN, adenomatous colon polyps, OA shoulders, ED.  Last seen 05/2020 for AWV. This visit is for chronic ds management.  HYPERTENSION Currently taking: see medication list Med Adherence: [x]  Yes    []  No Medication side effects: []  Yes    [x]  No Adherence with salt restriction: [x]  Yes    []  No Home Monitoring?: []  Yes    [x]  No Monitoring Frequency: []  Yes    []  No Home BP results range: []  Yes    []  No SOB? []  Yes    [x]  No Chest Pain?: []  Yes    [x]  No Leg swelling?: []  Yes    [x]  No Headaches?: []  Yes    [x]  No Dizziness? []  Yes    [x]  No Comments:   PSA was mildly elevated on lab tests done 04/2020.  Denies any problems urinating.  No hematuria.   Outpatient Encounter Medications as of 09/16/2020  Medication Sig   amLODipine (NORVASC) 10 MG tablet TAKE 1 TABLET (10 MG TOTAL) BY MOUTH DAILY.   diclofenac Sodium (VOLTAREN) 1 % GEL APPLY 2 G TOPICALLY 4 (FOUR) TIMES DAILY.   lisinopril (ZESTRIL) 10 MG tablet TAKE 1 & 1/2 TABLETS (15 MG TOTAL) BY MOUTH DAILY.   sildenafil (VIAGRA) 50 MG tablet 1 tab PO 1/2 hr to 1 hr prior to sex PRN.  Limit to 1 tab/24 hrs   traMADol (ULTRAM) 50 MG tablet Take 1 tablet (50 mg total) by mouth daily as  needed for moderate pain.   Na Sulfate-K Sulfate-Mg Sulf 17.5-3.13-1.6 GM/177ML SOLN TAKE 1 KIT BY MOUTH ONCE FOR 1 DOSE. SUPREP AS DIRECTED. NO SUBSTITUTIONS (Patient not taking: Reported on 09/16/2020)   No facility-administered encounter medications on file as of 09/16/2020.      Observations/Objective: Results for orders placed or performed in visit on 05/18/20  CBC  Result Value Ref Range   WBC 4.0 3.4 - 10.8 x10E3/uL   RBC 4.89 4.14 - 5.80 x10E6/uL   Hemoglobin 13.9 13.0 - 17.7 g/dL   Hematocrit 41.3 37.5 - 51.0 %   MCV 85 79 - 97 fL   MCH 28.4 26.6 - 33.0 pg   MCHC 33.7 31.5 - 35.7 g/dL   RDW 13.7 11.6 - 15.4 %   Platelets 182 150 - 450 x10E3/uL  Comprehensive metabolic panel  Result Value Ref Range   Glucose 89 65 - 99 mg/dL   BUN 10 8 - 27 mg/dL   Creatinine, Ser 0.93 0.76 - 1.27 mg/dL   GFR calc non Af Amer 84 >59 mL/min/1.73   GFR calc Af Amer 97 >59 mL/min/1.73   BUN/Creatinine Ratio 11 10 - 24   Sodium 140 134 - 144 mmol/L   Potassium 4.1 3.5 - 5.2 mmol/L   Chloride 103 96 - 106 mmol/L   CO2  22 20 - 29 mmol/L   Calcium 9.4 8.6 - 10.2 mg/dL   Total Protein 7.1 6.0 - 8.5 g/dL   Albumin 4.4 3.8 - 4.8 g/dL   Globulin, Total 2.7 1.5 - 4.5 g/dL   Albumin/Globulin Ratio 1.6 1.2 - 2.2   Bilirubin Total 0.8 0.0 - 1.2 mg/dL   Alkaline Phosphatase 64 44 - 121 IU/L   AST 17 0 - 40 IU/L   ALT 14 0 - 44 IU/L  Lipid panel  Result Value Ref Range   Cholesterol, Total 145 100 - 199 mg/dL   Triglycerides 55 0 - 149 mg/dL   HDL 50 >39 mg/dL   VLDL Cholesterol Cal 12 5 - 40 mg/dL   LDL Chol Calc (NIH) 83 0 - 99 mg/dL   Chol/HDL Ratio 2.9 0.0 - 5.0 ratio  PSA  Result Value Ref Range   Prostate Specific Ag, Serum 4.8 (H) 0.0 - 4.0 ng/mL     Assessment and Plan: 1. Essential hypertension Continue current medications and low-salt diet. - amLODipine (NORVASC) 10 MG tablet; TAKE 1 TABLET (10 MG TOTAL) BY MOUTH DAILY.  Dispense: 90 tablet; Refill: 1 - lisinopril (ZESTRIL)  10 MG tablet; TAKE 1 & 1/2 TABLETS (15 MG TOTAL) BY MOUTH DAILY.  Dispense: 135 tablet; Refill: 1  2. Elevated PSA He will come to the lab for [place] to repeat the PSA level. - PSA; Future   Follow Up Instructions: 4 mths   I discussed the assessment and treatment plan with the patient. The patient was provided an opportunity to ask questions and all were answered. The patient agreed with the plan and demonstrated an understanding of the instructions.   The patient was advised to call back or seek an in-person evaluation if the symptoms worsen or if the condition fails to improve as anticipated.  I  Spent 5 minutes on this telephone encounter  Karle Plumber, MD

## 2020-09-16 NOTE — Telephone Encounter (Signed)
Called pt, no answer, LVM to call back at [phone] to schedule a 4 month follow up per PCP for the week of Monday Oct 24th. Please advise and thank you

## 2020-09-17 ENCOUNTER — Other Ambulatory Visit: Payer: Self-pay

## 2020-09-17 NOTE — Addendum Note (Signed)
Addended bySteffanie Dunn on: 09/17/2020 04:25 PM   Modules accepted: Orders

## 2020-09-18 ENCOUNTER — Other Ambulatory Visit: Payer: Self-pay | Admitting: Internal Medicine

## 2020-09-18 DIAGNOSIS — R972 Elevated prostate specific antigen [PSA]: Secondary | ICD-10-CM

## 2020-09-18 LAB — PSA: Prostate Specific Ag, Serum: 7 ng/mL — ABNORMAL HIGH (ref 0.0–4.0)

## 2020-09-18 NOTE — Progress Notes (Signed)
Let pt know that his PSA level, which is the screening test for prostate cancer, is more elevated compared to 4 mths ago.  I recommend referral to a urologist for further evaluation.  I have submitted the referral.

## 2020-09-23 ENCOUNTER — Telehealth: Payer: Self-pay

## 2020-09-23 NOTE — Telephone Encounter (Signed)
Contacted pt to go over lab results pt is aware and doesn't have any questions or concerns 

## 2020-09-24 DIAGNOSIS — R350 Frequency of micturition: Secondary | ICD-10-CM | POA: Diagnosis not present

## 2020-10-02 IMAGING — CR LEFT SHOULDER - 2+ VIEW
2 series · 2 of 2 positions shown · non-contrast
Comparison: 01/20/2014 chest x-ray

CLINICAL DATA: Pt fell 2 weeks ago, pain now in Lt shoulder w
decreased ROM, pt unable to lift Lt arm for axillary view

EXAM:
LEFT SHOULDER - 2+ VIEW

[w shoulder ap internal left]
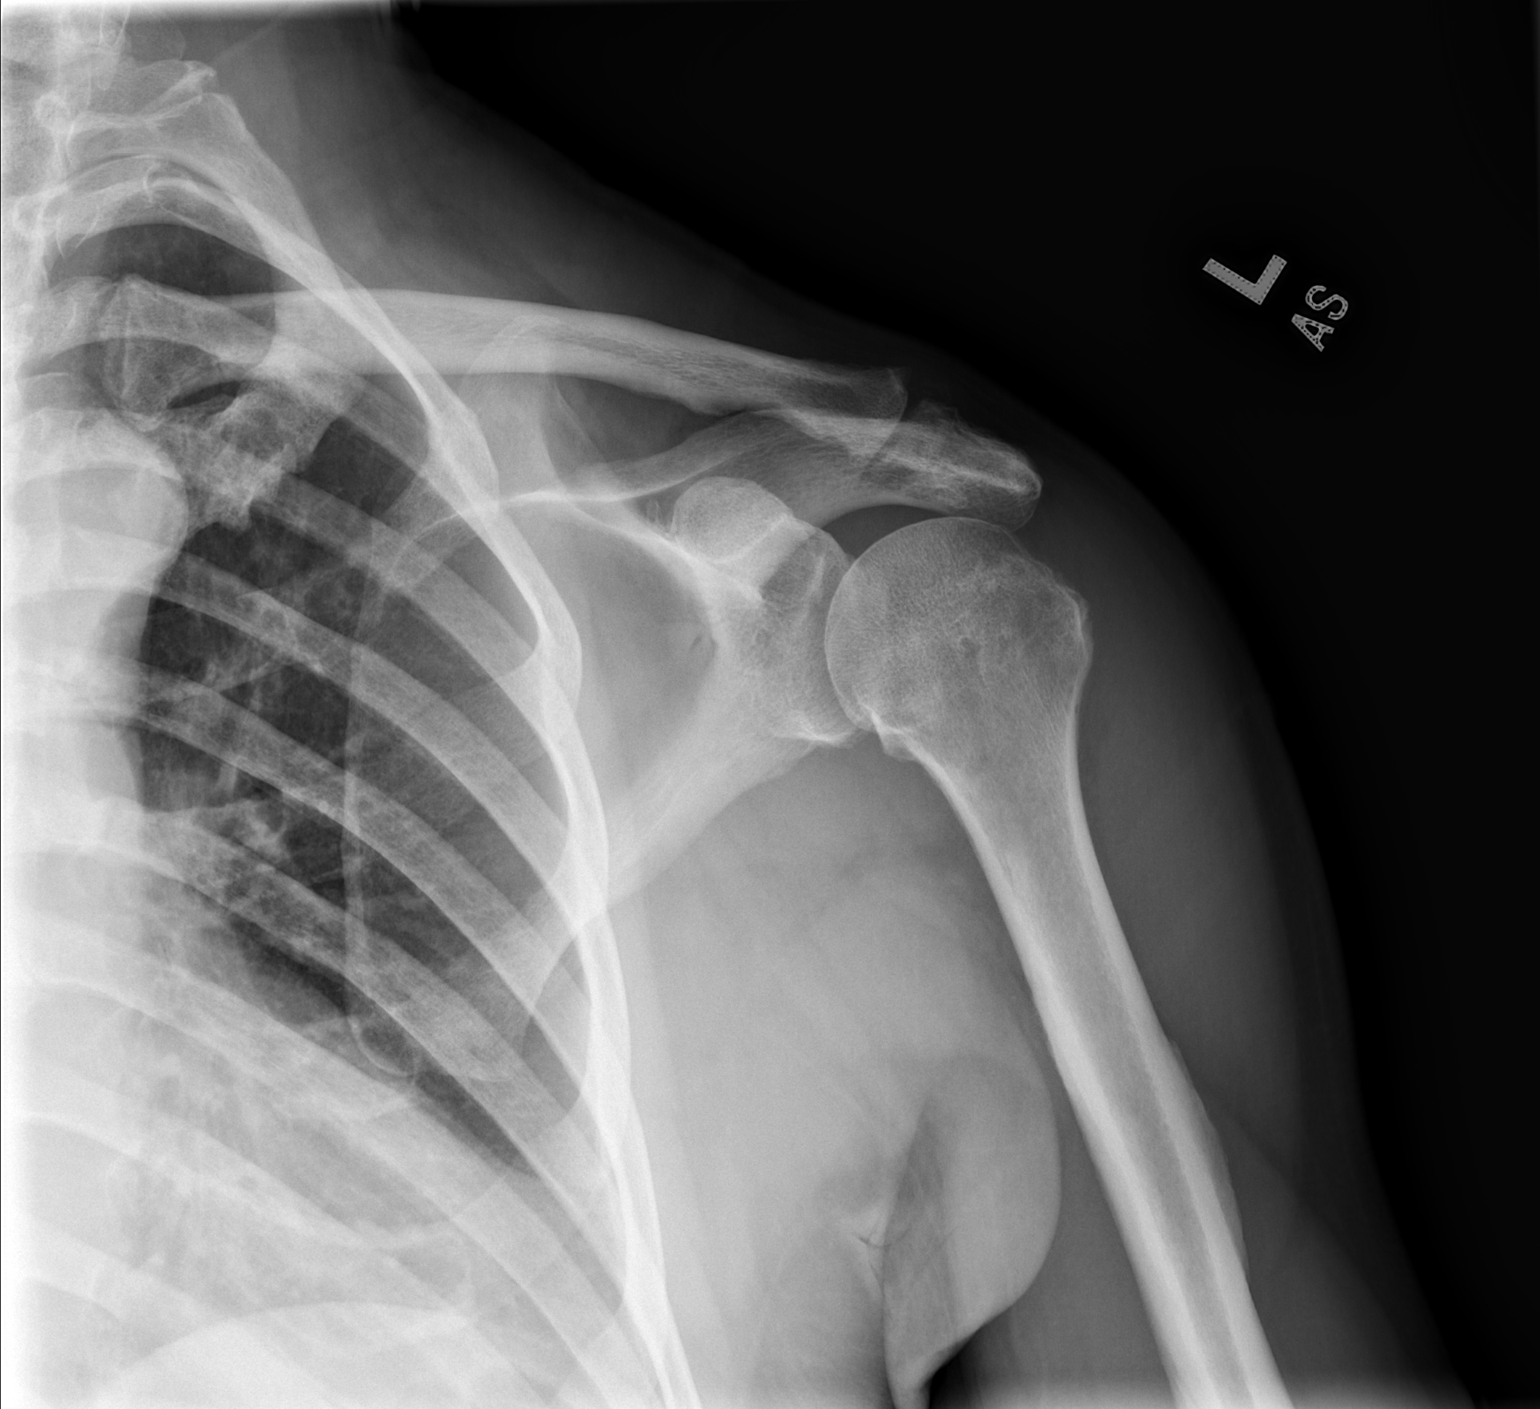

[w shoulder y view left]
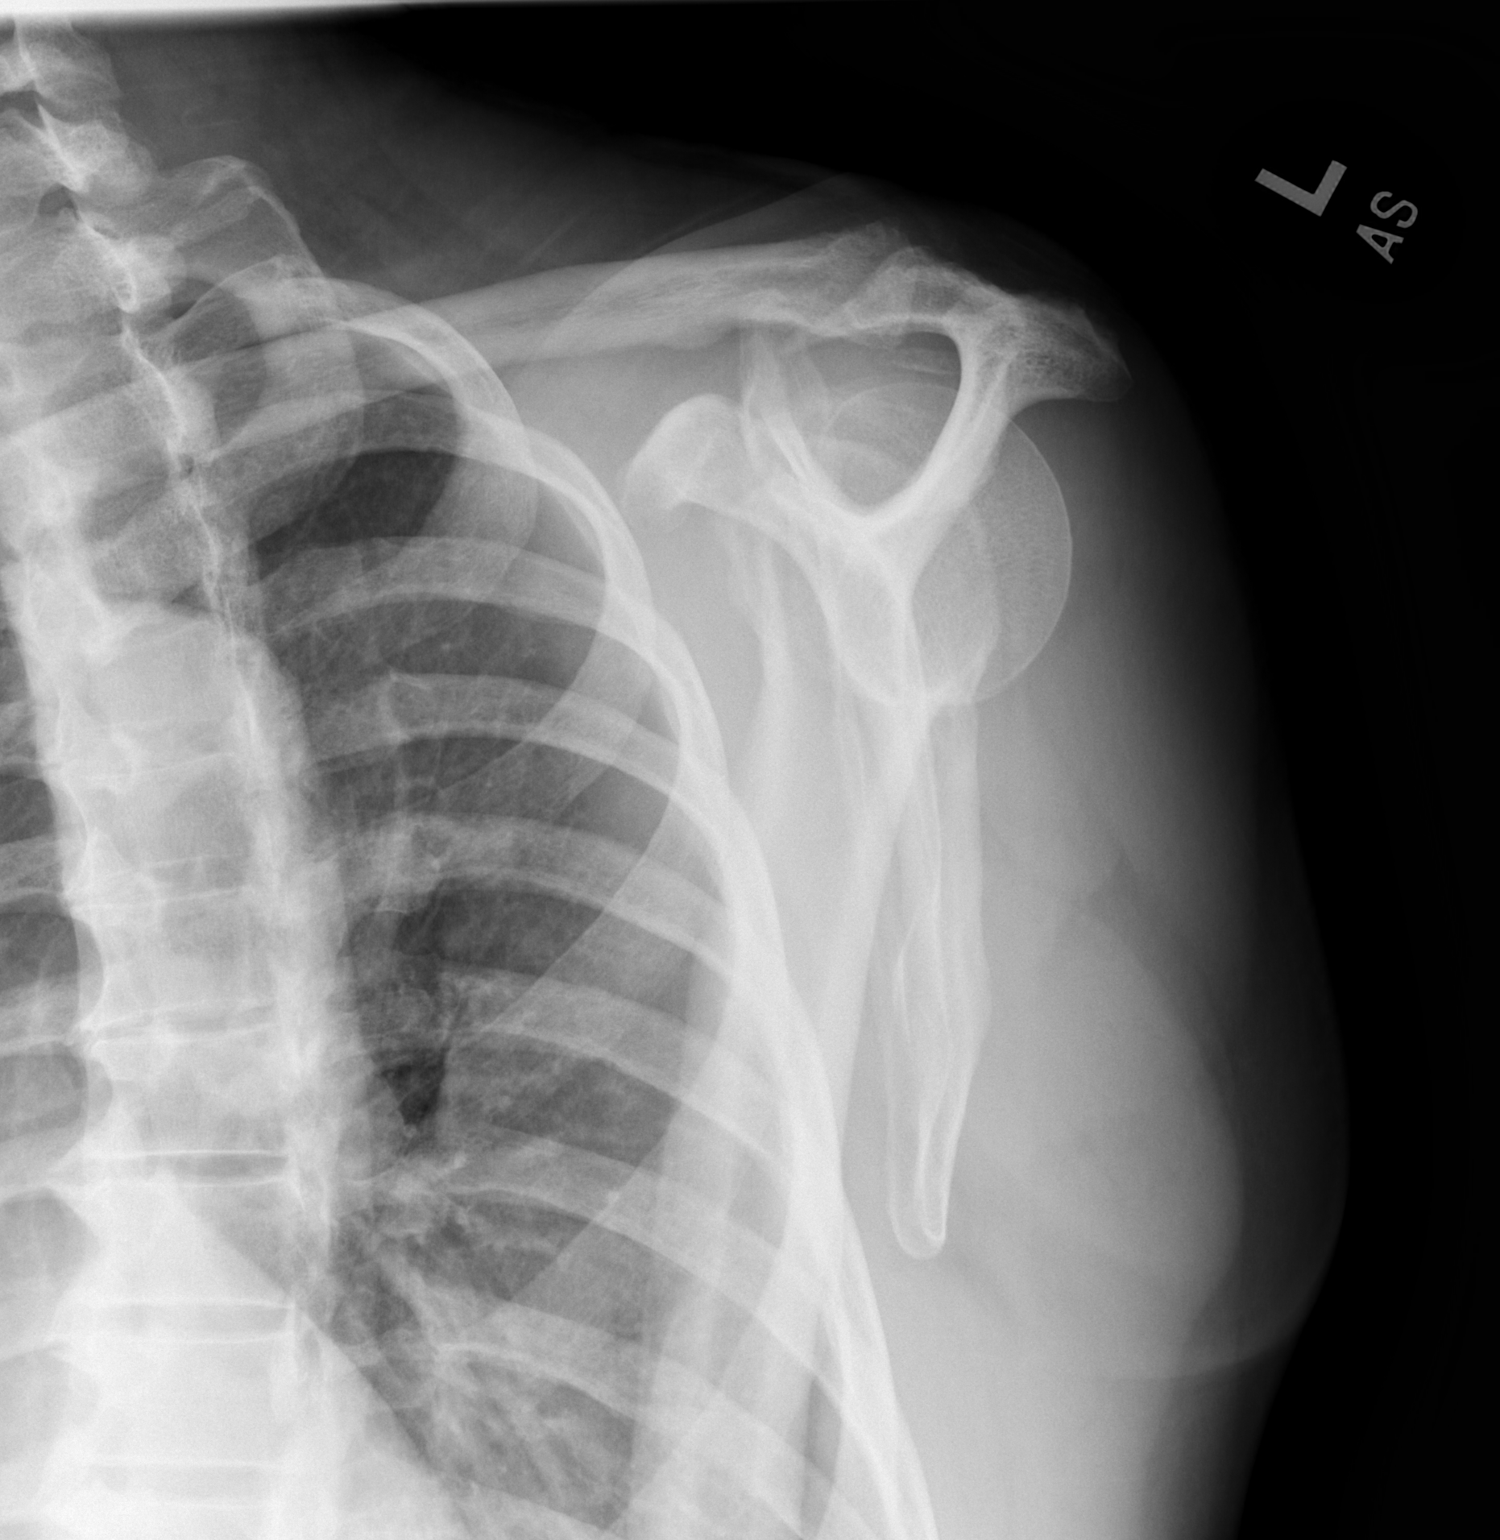

[2 of 2 positions shown; findings below may reference images not displayed]

FINDINGS: There mild degenerative changes in the shoulder. Subacromial joint
space narrowing. No acute fracture or subluxation.
IMPRESSION: No evidence for acute abnormality.

## 2020-12-22 ENCOUNTER — Other Ambulatory Visit: Payer: Self-pay

## 2020-12-23 ENCOUNTER — Other Ambulatory Visit: Payer: Self-pay

## 2021-01-10 DIAGNOSIS — H524 Presbyopia: Secondary | ICD-10-CM | POA: Diagnosis not present

## 2021-01-10 DIAGNOSIS — H2513 Age-related nuclear cataract, bilateral: Secondary | ICD-10-CM | POA: Diagnosis not present

## 2021-01-10 DIAGNOSIS — H52203 Unspecified astigmatism, bilateral: Secondary | ICD-10-CM | POA: Diagnosis not present

## 2021-01-10 DIAGNOSIS — H5213 Myopia, bilateral: Secondary | ICD-10-CM | POA: Diagnosis not present

## 2021-01-10 DIAGNOSIS — H25013 Cortical age-related cataract, bilateral: Secondary | ICD-10-CM | POA: Diagnosis not present

## 2021-01-14 ENCOUNTER — Other Ambulatory Visit: Payer: Self-pay | Admitting: Internal Medicine

## 2021-01-14 DIAGNOSIS — N529 Male erectile dysfunction, unspecified: Secondary | ICD-10-CM

## 2021-01-15 NOTE — Telephone Encounter (Signed)
Requested Prescriptions  Pending Prescriptions Disp Refills  . sildenafil (VIAGRA) 50 MG tablet [Pharmacy Med Name: SILDENAFIL 50 MG TABLET] 10 tablet 0    Sig: TAKE 1 TABLET BY MOUTH 1/2 HR PRIOR TO SEX AS NEEDED (1 TABLET PER 24 HR)     Urology: Erectile Dysfunction Agents Passed - 01/14/2021  2:53 PM      Passed - Last BP in normal range    BP Readings from Last 1 Encounters:  06/08/20 138/79         Passed - Valid encounter within last 12 months    Recent Outpatient Visits          4 months ago Elevated PSA   [place], MD   8 months ago Essential hypertension   [place], MD   12 months ago Erectile dysfunction, unspecified erectile dysfunction type   [place], Deborah B, MD   1 year ago Need for influenza vaccination   [place], Jarome Matin, RPH-CPP   1 year ago Essential hypertension   [place], MD      Future Appointments            In 2 days Ladell [place], MD [place]

## 2021-01-17 ENCOUNTER — Other Ambulatory Visit: Payer: Self-pay

## 2021-01-17 ENCOUNTER — Encounter: Payer: Self-pay | Admitting: Internal Medicine

## 2021-01-17 ENCOUNTER — Ambulatory Visit: Payer: Medicare Other | Attending: Internal Medicine | Admitting: Internal Medicine

## 2021-01-17 VITALS — BP 127/74 | HR 68 | Resp 16 | Wt 167.4 lb

## 2021-01-17 DIAGNOSIS — I10 Essential (primary) hypertension: Secondary | ICD-10-CM | POA: Diagnosis not present

## 2021-01-17 DIAGNOSIS — R972 Elevated prostate specific antigen [PSA]: Secondary | ICD-10-CM

## 2021-01-17 NOTE — Progress Notes (Signed)
Patient ID: Benjamin Nunez, male    DOB: 25-Feb-1951  MRN: [id number]  CC: Hypertension   Subjective: Benjamin Nunez is a 70 y.o. male who presents for chronic ds management His concerns today include:  HTN, adenomatous colon polyps, OA shoulders, ED.  Elev PSA:  last PSA was more elevated when last checked 08/2020 Saw urologist since last visit.  Reports he had a procedure done but not sure if it was a prostate bx.  Has f/u appt with them next mth at [facility]  HYPERTENSION Currently taking: see medication list Med Adherence: [x]  Yes  and took already for this a.m   []  No Medication side effects: []  Yes    [x]  No Adherence with salt restriction: [x]  Yes    []  No Home Monitoring?: [x]  Yes but not often   []  No Monitoring Frequency:  Home BP results range:  SOB? []  Yes    [x]  No Chest Pain?: []  Yes    [x]  No Leg swelling?: []  Yes    [x]  No Headaches?: []  Yes    [x]  No Dizziness? []  Yes    [x]  No Comments: walks daily in his neighborhood for 1 hr   I note his wgh down 13 lbs since 05/2020.  Pt reports this is intentional.  Eating less and "laying off the snacks." Moving bowels okay.   No blood in stools. No fevers/night sweats.  No palpitations.  No feeling of being hot or cold all the time.    HM: declines flu shot today. Thinks he will get it next mth Patient Active Problem List   Diagnosis Date Noted   Elevated PSA 05/19/2020   Erectile dysfunction 05/18/2020   Primary osteoarthritis of left shoulder 01/16/2020   Acute pain of left shoulder 10/22/2018   Over weight 04/02/2018   Adenomatous polyp of colon 03/02/2017   Essential hypertension 09/18/2016     Current Outpatient Medications on File Prior to Visit  Medication Sig Dispense Refill   amLODipine (NORVASC) 10 MG tablet TAKE 1 TABLET (10 MG TOTAL) BY MOUTH DAILY. 90 tablet 1   lisinopril (ZESTRIL) 10 MG tablet TAKE 1 & 1/2 TABLETS (15 MG TOTAL) BY MOUTH DAILY. 135 tablet 1   diclofenac Sodium (VOLTAREN) 1  % GEL APPLY 2 G TOPICALLY 4 (FOUR) TIMES DAILY. (Patient not taking: Reported on 01/17/2021) 100 g 6   sildenafil (VIAGRA) 50 MG tablet TAKE 1 TABLET BY MOUTH 1/2 HR PRIOR TO SEX AS NEEDED (1 TABLET PER 24 HR) (Patient not taking: Reported on 01/17/2021) 10 tablet 0   traMADol (ULTRAM) 50 MG tablet Take 1 tablet (50 mg total) by mouth daily as needed for moderate pain. (Patient not taking: Reported on 01/17/2021) 30 tablet 0   No current facility-administered medications on file prior to visit.    No Known Allergies  Social History   Socioeconomic History   Marital status: Married    Spouse name: Not on file   Number of children: Not on file   Years of education: Not on file   Highest education level: 9th grade  Occupational History   Not on file  Tobacco Use   Smoking status: Never   Smokeless tobacco: Never  Vaping Use   Vaping Use: Never used  Substance and Sexual Activity   Alcohol use: Yes    Comment: ocasionally   Drug use: No   Sexual activity: Not on file  Other Topics Concern   Not on file  Social History Narrative  Not on file   Social Determinants of Health   Financial Resource Strain: Low Risk    Difficulty of Paying Living Expenses: Not very hard  Food Insecurity: No Food Insecurity   Worried About Running Out of Food in the Last Year: Never true   Ran Out of Food in the Last Year: Never true  Transportation Needs: No Transportation Needs   Lack of Transportation (Medical): No   Lack of Transportation (Non-Medical): No  Physical Activity: Insufficiently Active   Days of Exercise per Week: 3 days   Minutes of Exercise per Session: 30 min  Stress: No Stress Concern Present   Feeling of Stress : Not at all  Social Connections: Socially Isolated   Frequency of Communication with Friends and Family: Never   Frequency of Social Gatherings with Friends and Family: Never   Attends Religious Services: Never   Printmaker: No    Attends Music therapist: Never   Marital Status: Married  Human resources officer Violence: Not At Risk   Fear of Current or Ex-Partner: No   Emotionally Abused: No   Physically Abused: No   Sexually Abused: No    Family History  Problem Relation Age of Onset   Diabetes Brother    Hypertension Brother    Asthma Neg Hx    Cancer Neg Hx    Heart failure Neg Hx    Hyperlipidemia Neg Hx    Colon cancer Neg Hx    Colon polyps Neg Hx    Esophageal cancer Neg Hx    Stomach cancer Neg Hx    Rectal cancer Neg Hx     Past Surgical History:  Procedure Laterality Date   COLONOSCOPY     ORIF FEMUR FRACTURE  1970s   POLYPECTOMY      ROS: Review of Systems Negative except as stated above  PHYSICAL EXAM: BP 127/74   Pulse 68   Resp 16   Wt 167 lb 6.4 oz (75.9 kg)   SpO2 99%   BMI 25.45 kg/m   Wt Readings from Last 3 Encounters:  01/17/21 167 lb 6.4 oz (75.9 kg)  06/08/20 180 lb 12.8 oz (82 kg)  05/18/20 180 lb (81.6 kg)    Physical Exam   General appearance - alert, well appearing, older African-American male and in no distress Mental status - normal mood, behavior, speech, dress, motor activity, and thought processes Neck - supple, no significant adenopathy Chest - clear to auscultation, no wheezes, rales or rhonchi, symmetric air entry Heart - normal rate, regular rhythm, normal S1, S2, no murmurs, rubs, clicks or gallops Extremities - peripheral pulses normal, no pedal edema, no clubbing or cyanosis  CMP Latest Ref Rng & Units 05/18/2020 04/07/2019 07/31/2017  Glucose 65 - 99 mg/dL 89 84 87  BUN 8 - 27 mg/dL 10 11 8   Creatinine 0.76 - 1.27 mg/dL 0.93 0.99 0.92  Sodium 134 - 144 mmol/L 140 140 141  Potassium 3.5 - 5.2 mmol/L 4.1 4.1 4.2  Chloride 96 - 106 mmol/L 103 103 104  CO2 20 - 29 mmol/L 22 23 23   Calcium 8.6 - 10.2 mg/dL 9.4 9.6 9.4  Total Protein 6.0 - 8.5 g/dL 7.1 7.2 7.4  Total Bilirubin 0.0 - 1.2 mg/dL 0.8 0.7 0.5  Alkaline Phos 44 - 121 IU/L 64  70 58  AST 0 - 40 IU/L 17 14 15   ALT 0 - 44 IU/L 14 14 14    Lipid Panel  Component Value Date/Time   CHOL 145 05/18/2020 1112   TRIG 55 05/18/2020 1112   HDL 50 05/18/2020 1112   CHOLHDL 2.9 05/18/2020 1112   LDLCALC 83 05/18/2020 1112    CBC    Component Value Date/Time   WBC 4.0 05/18/2020 1112   RBC 4.89 05/18/2020 1112   HGB 13.9 05/18/2020 1112   HCT 41.3 05/18/2020 1112   PLT 182 05/18/2020 1112   MCV 85 05/18/2020 1112   MCH 28.4 05/18/2020 1112   MCHC 33.7 05/18/2020 1112   RDW 13.7 05/18/2020 1112    ASSESSMENT AND PLAN: 1. Essential hypertension At goal.  Continue lisinopril and amlodipine.  2. Elevated PSA He will keep his follow-up appointment with the urologist next month.   Patient was given the opportunity to ask questions.  Patient verbalized understanding of the plan and was able to repeat key elements of the plan.   No orders of the defined types were placed in this encounter.    Requested Prescriptions    No prescriptions requested or ordered in this encounter    Return in about 4 months (around 05/20/2021) for f/u with [facility] in 2 wks for flu shot.  Karle Plumber, MD, FACP

## 2021-01-27 ENCOUNTER — Other Ambulatory Visit: Payer: Self-pay

## 2021-01-27 ENCOUNTER — Ambulatory Visit: Payer: Medicare Other | Attending: Internal Medicine

## 2021-01-27 DIAGNOSIS — Z23 Encounter for immunization: Secondary | ICD-10-CM

## 2021-01-27 NOTE — Progress Notes (Unsigned)
in

## 2021-04-05 ENCOUNTER — Other Ambulatory Visit: Payer: Self-pay | Admitting: Internal Medicine

## 2021-04-05 ENCOUNTER — Other Ambulatory Visit: Payer: Self-pay

## 2021-04-05 DIAGNOSIS — I10 Essential (primary) hypertension: Secondary | ICD-10-CM

## 2021-04-05 NOTE — Telephone Encounter (Signed)
Requested medication (s) are due for refill today:   Yes  Requested medication (s) are on the active medication list:   Yes  Future visit scheduled:   Yes   Last ordered: 09/16/2020 #135, 01 refill  Returned because labs out of date per protocol.     Requested Prescriptions  Pending Prescriptions Disp Refills   lisinopril (ZESTRIL) 10 MG tablet 135 tablet 1    Sig: TAKE 1 & 1/2 TABLETS (15 MG TOTAL) BY MOUTH DAILY.     Cardiovascular:  ACE Inhibitors Failed - 04/05/2021 11:34 AM      Failed - Cr in normal range and within 180 days    Creatinine, Ser  Date Value Ref Range Status  05/18/2020 0.93 0.76 - 1.27 mg/dL Final    Comment:                   **Effective May 24, 2020 [facility] will begin**                  reporting the 2021 CKD-EPI creatinine equation that                  estimates kidney function without a race variable.           Failed - K in normal range and within 180 days    Potassium  Date Value Ref Range Status  05/18/2020 4.1 3.5 - 5.2 mmol/L Final          Passed - Patient is not pregnant      Passed - Last BP in normal range    BP Readings from Last 1 Encounters:  01/17/21 127/74          Passed - Valid encounter within last 6 months    Recent Outpatient Visits           2 months ago Essential hypertension   [place], MD   6 months ago Elevated PSA   [place], MD   10 months ago Essential hypertension   [place], MD   1 year ago Erectile dysfunction, unspecified erectile dysfunction type   [place], Deborah B, MD   1 year ago Need for influenza vaccination   [place], RPH-CPP       Future Appointments             In 1 month Wynetta Emery, Dalbert Batman, MD [place]

## 2021-04-07 ENCOUNTER — Other Ambulatory Visit: Payer: Self-pay | Admitting: Internal Medicine

## 2021-04-07 ENCOUNTER — Other Ambulatory Visit: Payer: Self-pay

## 2021-04-07 DIAGNOSIS — I10 Essential (primary) hypertension: Secondary | ICD-10-CM

## 2021-04-07 MED ORDER — AMLODIPINE BESYLATE 10 MG PO TABS
ORAL_TABLET | Freq: Every day | ORAL | 1 refills | Status: DC
  Filled 2021-04-07: qty 90, 90d supply, fill #0
  Filled 2021-08-12: qty 90, 90d supply, fill #1

## 2021-04-07 MED ORDER — LISINOPRIL 10 MG PO TABS
ORAL_TABLET | ORAL | 1 refills | Status: DC
  Filled 2021-04-07: qty 135, 90d supply, fill #0
  Filled 2021-07-14: qty 135, 90d supply, fill #1

## 2021-04-07 NOTE — Telephone Encounter (Signed)
Requested medication (s) are due for refill today:   yes  Requested medication (s) are on the active medication list:   Yes  Future visit scheduled:   Yes in Feb.   Last ordered: 09/16/2020 #135, 1 refill  Returned because labs over due for cr. And potassium.      Requested Prescriptions  Pending Prescriptions Disp Refills   lisinopril (ZESTRIL) 10 MG tablet 135 tablet 1    Sig: TAKE 1 & 1/2 TABLETS (15 MG TOTAL) BY MOUTH DAILY.     Cardiovascular:  ACE Inhibitors Failed - 04/07/2021 12:18 PM      Failed - Cr in normal range and within 180 days    Creatinine, Ser  Date Value Ref Range Status  05/18/2020 0.93 0.76 - 1.27 mg/dL Final    Comment:                   **Effective May 24, 2020 [facility] will begin**                  reporting the 2021 CKD-EPI creatinine equation that                  estimates kidney function without a race variable.           Failed - K in normal range and within 180 days    Potassium  Date Value Ref Range Status  05/18/2020 4.1 3.5 - 5.2 mmol/L Final          Passed - Patient is not pregnant      Passed - Last BP in normal range    BP Readings from Last 1 Encounters:  01/17/21 127/74          Passed - Valid encounter within last 6 months    Recent Outpatient Visits           2 months ago Essential hypertension   [place], MD   6 months ago Elevated PSA   [place], MD   10 months ago Essential hypertension   [place], MD   1 year ago Erectile dysfunction, unspecified erectile dysfunction type   [place], Deborah B, MD   1 year ago Need for influenza vaccination   [place], RPH-CPP       Future Appointments             In 1 month Wynetta Emery, Dalbert Batman, MD [place]

## 2021-05-20 ENCOUNTER — Telehealth: Payer: Medicare Other | Admitting: Internal Medicine

## 2021-06-03 ENCOUNTER — Encounter: Payer: Self-pay | Admitting: Internal Medicine

## 2021-06-03 ENCOUNTER — Other Ambulatory Visit: Payer: Self-pay

## 2021-06-03 ENCOUNTER — Ambulatory Visit: Payer: Medicare Other | Attending: Internal Medicine | Admitting: Internal Medicine

## 2021-06-03 VITALS — BP 118/71 | HR 69 | Resp 18 | Ht 68.0 in | Wt 171.1 lb

## 2021-06-03 DIAGNOSIS — M19012 Primary osteoarthritis, left shoulder: Secondary | ICD-10-CM | POA: Diagnosis not present

## 2021-06-03 DIAGNOSIS — E663 Overweight: Secondary | ICD-10-CM | POA: Diagnosis not present

## 2021-06-03 DIAGNOSIS — I10 Essential (primary) hypertension: Secondary | ICD-10-CM

## 2021-06-03 LAB — POCT GLYCOSYLATED HEMOGLOBIN (HGB A1C): Hemoglobin A1C: 5.2 % (ref 4.0–5.6)

## 2021-06-03 MED ORDER — NAPROXEN 375 MG PO TABS
375.0000 mg | ORAL_TABLET | Freq: Two times a day (BID) | ORAL | 1 refills | Status: DC
  Filled 2021-06-03: qty 40, 20d supply, fill #0

## 2021-06-03 NOTE — Progress Notes (Signed)
? ? ?Patient ID: Benjamin Nunez, male    DOB: 1950/03/31  MRN: [id number] ? ?CC: Hypertension ? ? ?Subjective: ?Benjamin Nunez is a 71 y.o. male who presents for chronic ds management ?His concerns today include:  ?HTN, adenomatous colon polyps, OA shoulders, ED. ? ?Elev PSA: reports seeing Dr. Jeffie Pollock since last visit.  He has his f/u card, next visit is 08/24/21 ?Passing urine okay. ?No blood in urine ? ?HYPERTENSION ?Currently taking: see medication list ?Med Adherence: '[x]'$  Yes -Norvasc and Lisinopril ?Medication side effects: '[]'$  Yes    '[x]'$  No ?Adherence with salt restriction: '[x]'$  Yes    '[]'$  No ?Home Monitoring?: '[x]'$  Yes  but has been checking  '[]'$  No ?Monitoring Frequency:  ?Home BP results range:  ?SOB? '[]'$  Yes    '[x]'$  No ?Chest Pain?: '[]'$  Yes    '[x]'$  No ?Leg swelling?: '[]'$  Yes    '[x]'$  No ?Headaches?: '[]'$  Yes    '[x]'$  No ?Dizziness? '[]'$  Yes    '[x]'$  No ?Comments:  ? ?BMI slightly above 24.9. ?Reports he eats fairly healthy.   ?Walks several blocks 2 x a wk for exercise ?Sleeping well. Good appetite ?A1C today is 5.1 ? ?LT shoulder still flares up on him every now and then.  Would like RF on Naprosyn.  Takes one about twice a mth.  When it flares up he rest the jt for several days.   ? ?Patient Active Problem List  ? Diagnosis Date Noted  ? Elevated PSA 05/19/2020  ? Erectile dysfunction 05/18/2020  ? Primary osteoarthritis of left shoulder 01/16/2020  ? Acute pain of left shoulder 10/22/2018  ? Over weight 04/02/2018  ? Adenomatous polyp of colon 03/02/2017  ? Essential hypertension 09/18/2016  ?  ? ?Current Outpatient Medications on File Prior to Visit  ?Medication Sig Dispense Refill  ? amLODipine (NORVASC) 10 MG tablet TAKE 1 TABLET (10 MG TOTAL) BY MOUTH DAILY. 90 tablet 1  ? lisinopril (ZESTRIL) 10 MG tablet TAKE 1 & 1/2 TABLETS (15 MG TOTAL) BY MOUTH DAILY. 135 tablet 1  ? naproxen (NAPROSYN) 375 MG tablet Take 375 mg by mouth 2 (two) times daily with a meal.    ? sildenafil (VIAGRA) 50 MG tablet TAKE 1 TABLET BY MOUTH  1/2 HR PRIOR TO SEX AS NEEDED (1 TABLET PER 24 HR) 10 tablet 0  ? traMADol (ULTRAM) 50 MG tablet Take 1 tablet (50 mg total) by mouth daily as needed for moderate pain. 30 tablet 0  ? ?No current facility-administered medications on file prior to visit.  ? ? ?No Known Allergies ? ?Social History  ? ?Socioeconomic History  ? Marital status: Married  ?  Spouse name: Not on file  ? Number of children: Not on file  ? Years of education: Not on file  ? Highest education level: 9th grade  ?Occupational History  ? Not on file  ?Tobacco Use  ? Smoking status: Never  ? Smokeless tobacco: Never  ?Vaping Use  ? Vaping Use: Never used  ?Substance and Sexual Activity  ? Alcohol use: Yes  ?  Comment: ocasionally  ? Drug use: No  ? Sexual activity: Not on file  ?Other Topics Concern  ? Not on file  ?Social History Narrative  ? Not on file  ? ?Social Determinants of Health  ? ?Financial Resource Strain: Low Risk   ? Difficulty of Paying Living Expenses: Not very hard  ?Food Insecurity: No Food Insecurity  ? Worried About Charity fundraiser in the Last Year:  Never true  ? Ran Out of Food in the Last Year: Never true  ?Transportation Needs: No Transportation Needs  ? Lack of Transportation (Medical): No  ? Lack of Transportation (Non-Medical): No  ?Physical Activity: Insufficiently Active  ? Days of Exercise per Week: 3 days  ? Minutes of Exercise per Session: 30 min  ?Stress: No Stress Concern Present  ? Feeling of Stress : Not at all  ?Social Connections: Socially Isolated  ? Frequency of Communication with Friends and Family: Never  ? Frequency of Social Gatherings with Friends and Family: Never  ? Attends Religious Services: Never  ? Active Member of Clubs or Organizations: No  ? Attends Archivist Meetings: Never  ? Marital Status: Married  ?Intimate Partner Violence: Not At Risk  ? Fear of Current or Ex-Partner: No  ? Emotionally Abused: No  ? Physically Abused: No  ? Sexually Abused: No  ? ? ?Family History   ?Problem Relation Age of Onset  ? Diabetes Brother   ? Hypertension Brother   ? Asthma Neg Hx   ? Cancer Neg Hx   ? Heart failure Neg Hx   ? Hyperlipidemia Neg Hx   ? Colon cancer Neg Hx   ? Colon polyps Neg Hx   ? Esophageal cancer Neg Hx   ? Stomach cancer Neg Hx   ? Rectal cancer Neg Hx   ? ? ?Past Surgical History:  ?Procedure Laterality Date  ? COLONOSCOPY    ? ORIF FEMUR FRACTURE  1970s  ? POLYPECTOMY    ? ? ?ROS: ?Review of Systems ?Negative except as stated above ? ?PHYSICAL EXAM: ?BP 118/71   Pulse 69   Resp 18   Ht '5\' 8"'$  (1.727 m)   Wt 171 lb 2 oz (77.6 kg)   SpO2 100%   BMI 26.02 kg/m?   ?Wt Readings from Last 3 Encounters:  ?06/03/21 171 lb 2 oz (77.6 kg)  ?01/17/21 167 lb 6.4 oz (75.9 kg)  ?06/08/20 180 lb 12.8 oz (82 kg)  ? ? ?Physical Exam ? ? ?General appearance - alert, well appearing, elderly AAM and in no distress ?Mental status - normal mood, behavior, speech, dress, motor activity, and thought processes ?Neck - supple, no significant adenopathy ?Chest - clear to auscultation, no wheezes, rales or rhonchi, symmetric air entry ?Heart - normal rate, regular rhythm, normal S1, S2, no murmurs, rubs, clicks or gallops ?Musculoskeletal - LT shoulder:  no point tenderness.  Mild discomfort _ crepitus with passive ROM ?Extremities - peripheral pulses normal, no pedal edema, no clubbing or cyanosis ? ?CMP Latest Ref Rng & Units 05/18/2020 04/07/2019 07/31/2017  ?Glucose 65 - 99 mg/dL 89 84 87  ?BUN 8 - 27 mg/dL '10 11 8  '$ ?Creatinine 0.76 - 1.27 mg/dL 0.93 0.99 0.92  ?Sodium 134 - 144 mmol/L 140 140 141  ?Potassium 3.5 - 5.2 mmol/L 4.1 4.1 4.2  ?Chloride 96 - 106 mmol/L 103 103 104  ?CO2 20 - 29 mmol/L '22 23 23  '$ ?Calcium 8.6 - 10.2 mg/dL 9.4 9.6 9.4  ?Total Protein 6.0 - 8.5 g/dL 7.1 7.2 7.4  ?Total Bilirubin 0.0 - 1.2 mg/dL 0.8 0.7 0.5  ?Alkaline Phos 44 - 121 IU/L 64 70 58  ?AST 0 - 40 IU/L '17 14 15  '$ ?ALT 0 - 44 IU/L '14 14 14  '$ ? ?Lipid Panel  ?   ?Component Value Date/Time  ? CHOL 145 05/18/2020 1112   ? TRIG 55 05/18/2020 1112  ? HDL 50 05/18/2020  1112  ? CHOLHDL 2.9 05/18/2020 1112  ? [place] 83 05/18/2020 1112  ? ? ?CBC ?   ?Component Value Date/Time  ? WBC 4.0 05/18/2020 1112  ? RBC 4.89 05/18/2020 1112  ? HGB 13.9 05/18/2020 1112  ? HCT 41.3 05/18/2020 1112  ? PLT 182 05/18/2020 1112  ? MCV 85 05/18/2020 1112  ? MCH 28.4 05/18/2020 1112  ? MCHC 33.7 05/18/2020 1112  ? RDW 13.7 05/18/2020 1112  ? ? ?ASSESSMENT AND PLAN: ?1. Essential hypertension ?At goal. Continue Lisinopril and Norvasc and DASH diet ?- CBC ?- Comprehensive metabolic panel ?- Lipid panel ? ?2. Primary osteoarthritis of left shoulder ?Pt declines ortho referral. ?He takes Naprosyn infrequently.  Advised to take with food ?- naproxen (NAPROSYN) 375 MG tablet; Take 1 tablet (375 mg total) by mouth 2 (two) times daily with a meal.  Dispense: 40 tablet; Refill: 1 ? ?3. Overweight (BMI 25.0-29.9) ?-encourage him to continue healthy eating habits and regular exercise ?- HgB A1c ? ? ? ? ?Patient was given the opportunity to ask questions.  Patient verbalized understanding of the plan and was able to repeat key elements of the plan.  ? ?This documentation was completed using Radio producer.  Any transcriptional errors are unintentional. ? ?Orders Placed This Encounter  ?Procedures  ? HgB A1c  ? ? ? ?Requested Prescriptions  ? ? No prescriptions requested or ordered in this encounter  ? ? ?No follow-ups on file. ? ?Karle Plumber, MD, FACP ?

## 2021-06-04 LAB — LIPID PANEL
Chol/HDL Ratio: 2.8 ratio (ref 0.0–5.0)
Cholesterol, Total: 131 mg/dL (ref 100–199)
HDL: 46 mg/dL (ref >39)
LDL Chol Calc (NIH): 75 mg/dL (ref 0–99)
Triglycerides: 40 mg/dL (ref 0–149)
VLDL Cholesterol Cal: 10 mg/dL (ref 5–40)

## 2021-06-04 LAB — CBC
Hematocrit: 42.1 % (ref 37.5–51.0)
Hemoglobin: 14.3 g/dL (ref 13.0–17.7)
MCH: 28.7 pg (ref 26.6–33.0)
MCHC: 34 g/dL (ref 31.5–35.7)
MCV: 84 fL (ref 79–97)
Platelets: 183 10*3/uL (ref 150–450)
RBC: 4.99 x10E6/uL (ref 4.14–5.80)
RDW: 13.8 % (ref 11.6–15.4)
WBC: 4.5 10*3/uL (ref 3.4–10.8)

## 2021-06-04 LAB — COMPREHENSIVE METABOLIC PANEL
ALT: 11 IU/L (ref 0–44)
AST: 17 IU/L (ref 0–40)
Albumin/Globulin Ratio: 1.7 (ref 1.2–2.2)
Albumin: 4.8 g/dL (ref 3.8–4.8)
Alkaline Phosphatase: 61 IU/L (ref 44–121)
BUN/Creatinine Ratio: 11 (ref 10–24)
BUN: 10 mg/dL (ref 8–27)
Bilirubin Total: 0.4 mg/dL (ref 0.0–1.2)
CO2: 25 mmol/L (ref 20–29)
Calcium: 9.5 mg/dL (ref 8.6–10.2)
Chloride: 100 mmol/L (ref 96–106)
Creatinine, Ser: 0.87 mg/dL (ref 0.76–1.27)
Globulin, Total: 2.9 g/dL (ref 1.5–4.5)
Glucose: 81 mg/dL (ref 70–99)
Potassium: 4.6 mmol/L (ref 3.5–5.2)
Sodium: 136 mmol/L (ref 134–144)
Total Protein: 7.7 g/dL (ref 6.0–8.5)
eGFR: 93 mL/min/{1.73_m2} (ref >59)

## 2021-06-04 NOTE — Progress Notes (Signed)
Let pt know that his kidney and LFT are normal.   ?Cholesterol level normal. ?Blood cell counts are normal.

## 2021-06-06 ENCOUNTER — Other Ambulatory Visit: Payer: Self-pay

## 2021-06-08 ENCOUNTER — Telehealth: Payer: Self-pay

## 2021-06-08 NOTE — Telephone Encounter (Signed)
Contacted pt to go over lab results pt is aware and doesn't have any questions or concerns 

## 2021-07-01 ENCOUNTER — Telehealth: Payer: Self-pay | Admitting: Internal Medicine

## 2021-07-01 ENCOUNTER — Other Ambulatory Visit: Payer: Self-pay

## 2021-07-01 ENCOUNTER — Ambulatory Visit: Payer: Medicare Other | Admitting: Pharmacist

## 2021-07-01 NOTE — Telephone Encounter (Signed)
Pt is calling to reschedule appt with [facility]  ?Apt was Schedule the day the office was closed  ?Please advise ?

## 2021-07-14 ENCOUNTER — Other Ambulatory Visit: Payer: Self-pay

## 2021-07-15 ENCOUNTER — Other Ambulatory Visit: Payer: Self-pay

## 2021-08-12 ENCOUNTER — Other Ambulatory Visit: Payer: Self-pay

## 2021-10-04 ENCOUNTER — Encounter: Payer: Self-pay | Admitting: Internal Medicine

## 2021-10-04 ENCOUNTER — Other Ambulatory Visit: Payer: Self-pay

## 2021-10-04 ENCOUNTER — Ambulatory Visit: Payer: Medicare Other | Attending: Internal Medicine | Admitting: Internal Medicine

## 2021-10-04 VITALS — BP 132/65 | HR 68 | Temp 98.3°F | Wt 166.0 lb

## 2021-10-04 DIAGNOSIS — M19012 Primary osteoarthritis, left shoulder: Secondary | ICD-10-CM | POA: Diagnosis not present

## 2021-10-04 DIAGNOSIS — E663 Overweight: Secondary | ICD-10-CM | POA: Diagnosis not present

## 2021-10-04 DIAGNOSIS — I10 Essential (primary) hypertension: Secondary | ICD-10-CM | POA: Diagnosis not present

## 2021-10-04 MED ORDER — DICLOFENAC SODIUM 1 % EX GEL
2.0000 g | Freq: Four times a day (QID) | CUTANEOUS | 5 refills | Status: AC
  Filled 2021-10-04: qty 100, 17d supply, fill #0

## 2021-10-04 MED ORDER — LISINOPRIL 10 MG PO TABS
ORAL_TABLET | ORAL | 1 refills | Status: DC
  Filled 2021-10-04 – 2021-10-10 (×2): qty 135, 90d supply, fill #0

## 2021-10-04 MED ORDER — AMLODIPINE BESYLATE 10 MG PO TABS
ORAL_TABLET | Freq: Every day | ORAL | 1 refills | Status: DC
  Filled 2021-10-04: qty 90, fill #0
  Filled 2021-11-25: qty 90, 90d supply, fill #0

## 2021-10-04 NOTE — Progress Notes (Signed)
Patient ID: Benjamin Nunez, male    DOB: May 14, 1950  MRN: [id number]  CC: Medication Refill and Hypertension   Subjective: Benjamin Nunez is a 71 y.o. male who presents for chronic ds management His concerns today include:  HTN, adenomatous colon polyps, OA shoulders, ED.  HYPERTENSION Currently taking: see medication list Lisinopril 15 mg and Norvasc 10 mg daily Med Adherence: '[x]'$  Yes    '[]'$  No Medication side effects: '[]'$  Yes    '[x]'$  No Adherence with salt restriction: '[x]'$  Yes    '[]'$  No Home Monitoring?: '[x]'$  Yes    '[]'$  No Monitoring Frequency: not often Home BP results range:  SOB? '[]'$  Yes    '[x]'$  No Chest Pain?: '[]'$  Yes    '[x]'$  No Leg swelling?: '[]'$  Yes    '[x]'$  No Headaches?: '[]'$  Yes    '[x]'$  No Dizziness? '[]'$  Yes    '[x]'$  No Comments:  walks daily for 1 hr.  Does well with eating habits - seldom eats red meats.  Eats a lot of fruits and veggies. Wgh down 5 lbs since last visit.Marland Kitchen  BMI today is slightly above normal range at 25.  OA LT shoulder: sometimes he gets flare up in pain. Sometimes gets pain in LT knee as well; hx of knee fx in past Pain in the shoulder does not significant impact his quality of life. Voltaren gel helps a lot.  Request Rfs  Reports having seen his urologist Dr. Jeffie Pollock about 1 month ago.  Reports being told everything looks okay and to follow-up with him in 1 year.  HM:  declines shingles vaccine. Patient Active Problem List   Diagnosis Date Noted   Elevated PSA 05/19/2020   Erectile dysfunction 05/18/2020   Primary osteoarthritis of left shoulder 01/16/2020   Acute pain of left shoulder 10/22/2018   Over weight 04/02/2018   Adenomatous polyp of colon 03/02/2017   Essential hypertension 09/18/2016     Current Outpatient Medications on File Prior to Visit  Medication Sig Dispense Refill   naproxen (NAPROSYN) 375 MG tablet Take 1 tablet (375 mg total) by mouth 2 (two) times daily with a meal. 40 tablet 1   sildenafil (VIAGRA) 50 MG tablet TAKE 1 TABLET BY  MOUTH 1/2 HR PRIOR TO SEX AS NEEDED (1 TABLET PER 24 HR) 10 tablet 0   traMADol (ULTRAM) 50 MG tablet Take 1 tablet (50 mg total) by mouth daily as needed for moderate pain. 30 tablet 0   No current facility-administered medications on file prior to visit.    No Known Allergies  Social History   Socioeconomic History   Marital status: Married    Spouse name: Not on file   Number of children: Not on file   Years of education: Not on file   Highest education level: 9th grade  Occupational History   Not on file  Tobacco Use   Smoking status: Never   Smokeless tobacco: Never  Vaping Use   Vaping Use: Never used  Substance and Sexual Activity   Alcohol use: Yes    Comment: ocasionally   Drug use: No   Sexual activity: Not on file  Other Topics Concern   Not on file  Social History Narrative   Not on file   Social Determinants of Health   Financial Resource Strain: Low Risk  (06/08/2020)   Overall Financial Resource Strain (CARDIA)    Difficulty of Paying Living Expenses: Not very hard  Food Insecurity: No Food Insecurity (06/08/2020)   Hunger Vital  Sign    Worried About Charity fundraiser in the Last Year: Never true    [place] in the Last Year: Never true  Transportation Needs: No Transportation Needs (06/08/2020)   PRAPARE - Hydrologist (Medical): No    Lack of Transportation (Non-Medical): No  Physical Activity: Insufficiently Active (06/08/2020)   Exercise Vital Sign    Days of Exercise per Week: 3 days    Minutes of Exercise per Session: 30 min  Stress: No Stress Concern Present (06/08/2020)   [place]    Feeling of Stress : Not at all  Social Connections: Socially Isolated (06/08/2020)   Social Connection and Isolation Panel [NHANES]    Frequency of Communication with Friends and Family: Never    Frequency of Social Gatherings with Friends and Family: Never     Attends Religious Services: Never    Marine scientist or Organizations: No    Attends Archivist Meetings: Never    Marital Status: Married  Human resources officer Violence: Not At Risk (06/08/2020)   Humiliation, Afraid, Rape, and Kick questionnaire    Fear of Current or Ex-Partner: No    Emotionally Abused: No    Physically Abused: No    Sexually Abused: No    Family History  Problem Relation Age of Onset   Diabetes Brother    Hypertension Brother    Asthma Neg Hx    Cancer Neg Hx    Heart failure Neg Hx    Hyperlipidemia Neg Hx    Colon cancer Neg Hx    Colon polyps Neg Hx    Esophageal cancer Neg Hx    Stomach cancer Neg Hx    Rectal cancer Neg Hx     Past Surgical History:  Procedure Laterality Date   COLONOSCOPY     ORIF FEMUR FRACTURE  1970s   POLYPECTOMY      ROS: Review of Systems Negative except as stated above  PHYSICAL EXAM: BP 132/65   Pulse 68   Temp 98.3 F (36.8 C) (Oral)   Wt 166 lb (75.3 kg)   SpO2 99%   BMI 25.24 kg/m   Wt Readings from Last 3 Encounters:  10/04/21 166 lb (75.3 kg)  06/03/21 171 lb 2 oz (77.6 kg)  01/17/21 167 lb 6.4 oz (75.9 kg)    Physical Exam  General appearance - alert, well appearing, elderly African-American male and in no distress Mental status - normal mood, behavior, speech, dress, motor activity, and thought processes Eyes - pupils equal and reactive, extraocular eye movements intact Neck - supple, no significant adenopathy Chest - clear to auscultation, no wheezes, rales or rhonchi, symmetric air entry Heart - normal rate, regular rhythm, normal S1, S2, no murmurs, rubs, clicks or gallops Extremities - peripheral pulses normal, no pedal edema, no clubbing or cyanosis MSK: Left shoulder: No point tenderness on palpation of the anterior and posterior joint.  Good range of motion but some crepitus appreciated.     Latest Ref Rng & Units 06/03/2021    2:52 PM 05/18/2020   11:12 AM 04/07/2019   10:50  AM  CMP  Glucose 70 - 99 mg/dL 81  89  84   BUN 8 - 27 mg/dL '10  10  11   '$ Creatinine 0.76 - 1.27 mg/dL 0.87  0.93  0.99   Sodium 134 - 144 mmol/L 136  140  140  Potassium 3.5 - 5.2 mmol/L 4.6  4.1  4.1   Chloride 96 - 106 mmol/L 100  103  103   CO2 20 - 29 mmol/L '25  22  23   '$ Calcium 8.6 - 10.2 mg/dL 9.5  9.4  9.6   Total Protein 6.0 - 8.5 g/dL 7.7  7.1  7.2   Total Bilirubin 0.0 - 1.2 mg/dL 0.4  0.8  0.7   Alkaline Phos 44 - 121 IU/L 61  64  70   AST 0 - 40 IU/L '17  17  14   '$ ALT 0 - 44 IU/L '11  14  14    '$ Lipid Panel     Component Value Date/Time   CHOL 131 06/03/2021 1452   TRIG 40 06/03/2021 1452   HDL 46 06/03/2021 1452   CHOLHDL 2.8 06/03/2021 1452   LDLCALC 75 06/03/2021 1452    CBC    Component Value Date/Time   WBC 4.5 06/03/2021 1452   RBC 4.99 06/03/2021 1452   HGB 14.3 06/03/2021 1452   HCT 42.1 06/03/2021 1452   PLT 183 06/03/2021 1452   MCV 84 06/03/2021 1452   MCH 28.7 06/03/2021 1452   MCHC 34.0 06/03/2021 1452   RDW 13.8 06/03/2021 1452    ASSESSMENT AND PLAN: 1. Essential hypertension -Close to goal.  He will continue current dose of amlodipine and lisinopril.  Continue to limit salt in the foods. - amLODipine (NORVASC) 10 MG tablet; TAKE 1 TABLET (10 MG TOTAL) BY MOUTH DAILY.  Dispense: 90 tablet; Refill: 1 - lisinopril (ZESTRIL) 10 MG tablet; TAKE 1 & 1/2 TABLETS (15 MG TOTAL) BY MOUTH DAILY.  Dispense: 135 tablet; Refill: 1  2. Primary osteoarthritis of left shoulder stable and not at the level where it is significantly impacting his quality of life.  Not interested in seeing orthopedics at this time. Refill given on Voltaren gel which he states helps quite a bit.  Advised to avoid activities that may cause flare. - diclofenac Sodium (VOLTAREN) 1 % GEL; Apply 2 grams topically 4 (four) times daily.  Dispense: 100 g; Refill: 5  3. Overweight (BMI 25.0-29.9) Encouraged him to continue regular exercise and healthy eating.    Patient was given  the opportunity to ask questions.  Patient verbalized understanding of the plan and was able to repeat key elements of the plan.   This documentation was completed using Radio producer.  Any transcriptional errors are unintentional.  No orders of the defined types were placed in this encounter.    Requested Prescriptions   Signed Prescriptions Disp Refills   diclofenac Sodium (VOLTAREN) 1 % GEL 100 g 5    Sig: Apply 2 grams topically 4 (four) times daily.   amLODipine (NORVASC) 10 MG tablet 90 tablet 1    Sig: TAKE 1 TABLET (10 MG TOTAL) BY MOUTH DAILY.   lisinopril (ZESTRIL) 10 MG tablet 135 tablet 1    Sig: TAKE 1 & 1/2 TABLETS (15 MG TOTAL) BY MOUTH DAILY.    Return in about 4 months (around 02/04/2022) for Give appt with Lurena Joiner in 1 month for Medicare Wellness Visit.  Karle Plumber, MD, FACP

## 2021-10-04 NOTE — Progress Notes (Signed)
Lisinopril refill- please send refill in for 3 months  Diclofenac gel- refill

## 2021-10-10 ENCOUNTER — Other Ambulatory Visit: Payer: Self-pay | Admitting: Pharmacist

## 2021-10-10 ENCOUNTER — Other Ambulatory Visit: Payer: Self-pay

## 2021-10-10 NOTE — Chronic Care Management (AMB) (Signed)
Patient was seen in person by Park Liter, PharmD Candidate at [facility]. Pt took his BP medications this morning (amlodipine 10 mg daily, lisinopril 15 mg daily). Does not consume caffeine or nicotine products.   Patient has an automated home blood pressure machine. They report home readings of SBP 135-137. Does not remember DBP. Checks BP every 2-3 days. Pt has validated home cuff in clinic at a prior appt.   Medication review was performed. They are taking medications as prescribed. Denies adverse effects with medications   The following barriers to adherence were noted:  - Denies concerns with medication access or understanding.   The following interventions were completed:  - Medications were reviewed  - Patient was educated on medications, including indication and administration  - Patient was educated on proper technique to check home blood pressure and reminded to bring home machine and readings to next provider appointment  - Patient was counseled on lifestyle modifications to improve blood pressure including increasing physical activity by increasing walking duration (while staying hydrated) and limiting sodium in diet.   The patient has follow up scheduled on 11/04/21 with CPP Daisy Blossom:  PCP: Dr. Ronaldo Miyamoto, PharmD, [facility]

## 2021-11-04 ENCOUNTER — Encounter: Payer: Medicare Other | Admitting: Pharmacist

## 2021-11-25 ENCOUNTER — Other Ambulatory Visit: Payer: Self-pay

## 2022-01-11 DIAGNOSIS — H25813 Combined forms of age-related cataract, bilateral: Secondary | ICD-10-CM | POA: Diagnosis not present

## 2022-01-11 DIAGNOSIS — H52203 Unspecified astigmatism, bilateral: Secondary | ICD-10-CM | POA: Diagnosis not present

## 2022-01-11 DIAGNOSIS — H524 Presbyopia: Secondary | ICD-10-CM | POA: Diagnosis not present

## 2022-02-07 ENCOUNTER — Other Ambulatory Visit: Payer: Self-pay

## 2022-02-07 ENCOUNTER — Encounter: Payer: Self-pay | Admitting: Internal Medicine

## 2022-02-07 ENCOUNTER — Ambulatory Visit: Payer: Medicare Other | Attending: Internal Medicine | Admitting: Internal Medicine

## 2022-02-07 VITALS — BP 110/73 | HR 80 | Temp 97.7°F | Ht 68.0 in | Wt 158.0 lb

## 2022-02-07 DIAGNOSIS — M19011 Primary osteoarthritis, right shoulder: Secondary | ICD-10-CM | POA: Diagnosis not present

## 2022-02-07 DIAGNOSIS — G4700 Insomnia, unspecified: Secondary | ICD-10-CM | POA: Diagnosis not present

## 2022-02-07 DIAGNOSIS — Z23 Encounter for immunization: Secondary | ICD-10-CM | POA: Diagnosis not present

## 2022-02-07 DIAGNOSIS — R634 Abnormal weight loss: Secondary | ICD-10-CM

## 2022-02-07 DIAGNOSIS — I10 Essential (primary) hypertension: Secondary | ICD-10-CM

## 2022-02-07 DIAGNOSIS — M19012 Primary osteoarthritis, left shoulder: Secondary | ICD-10-CM | POA: Diagnosis not present

## 2022-02-07 DIAGNOSIS — R972 Elevated prostate specific antigen [PSA]: Secondary | ICD-10-CM

## 2022-02-07 MED ORDER — LISINOPRIL 10 MG PO TABS
ORAL_TABLET | ORAL | 1 refills | Status: DC
  Filled 2022-02-07: qty 135, 90d supply, fill #0
  Filled 2022-05-25: qty 135, 90d supply, fill #1

## 2022-02-07 MED ORDER — AMLODIPINE BESYLATE 10 MG PO TABS
ORAL_TABLET | Freq: Every day | ORAL | 1 refills | Status: DC
  Filled 2022-02-07: qty 90, fill #0
  Filled 2022-03-16: qty 90, 90d supply, fill #0
  Filled 2022-07-05: qty 90, 90d supply, fill #1

## 2022-02-07 MED ORDER — TRAZODONE HCL 50 MG PO TABS
25.0000 mg | ORAL_TABLET | Freq: Every day | ORAL | 3 refills | Status: DC
  Filled 2022-02-07: qty 30, 60d supply, fill #0
  Filled 2022-04-21: qty 30, 60d supply, fill #1
  Filled 2022-07-05: qty 30, 60d supply, fill #2
  Filled 2022-08-29 – 2022-09-11 (×2): qty 30, 60d supply, fill #3

## 2022-02-07 NOTE — Patient Instructions (Signed)
I have sent prescription to our pharmacy for your blood pressure medications and a sleep medication called Trazodone.  Please try to eat three solid meals a day.  Please try to eat more fruits daily.

## 2022-02-07 NOTE — Progress Notes (Signed)
Patient ID: Benjamin Nunez, male    DOB: [birth year]  MRN: [id number]  CC: Hypertension (HTN f/u. Med refill. /No to flu vax. )   Subjective: Benjamin Nunez is a 71 y.o. male who presents for chronic ds management His concerns today include:  HTN, adenomatous colon polyps, OA shoulders, ED.   HYPERTENSION Currently taking: see medication list Med Adherence: '[x]'$  Yes -Norvasc 10 mg daily and lisinopril 15 mg daily Medication side effects: '[]'$  Yes    '[x]'$  No Adherence with salt restriction: '[x]'$  Yes    '[]'$  No Home Monitoring?: '[]'$  Yes    '[x]'$  No Monitoring Frequency:  Home BP results range:  SOB? '[]'$  Yes    '[x]'$  No Chest Pain?: '[]'$  Yes    '[x]'$  No Leg swelling?: '[]'$  Yes    '[x]'$  No Headaches?: '[]'$  Yes    '[x]'$  No Dizziness? '[]'$  Yes    '[x]'$  No Comments:   I note wgh down 22 lbs since March of last yr.  Down 8 lbs since I saw him 4 mths ago -reports eating 2 meals a day BF and dinner.  Has some chips during the day with water or regular soda. Denies polyuria/polydipsia. Moves bowel ok with out blood in stools.  No problems swallowing.  No issues passing urine or blood in urine.   No fever/night sweats, palpitations Denies any feeling of depression Walks once a day through his neighborhood several days a wk.  Walks about 1 mile.  He has history of elevated PSA level.  Highest level was 7.  He had reported seeing the urologist Dr. Jeffie Pollock in the spring of this yr.  OA: Intermittent pain in the shoulder joints.  Uses Voltaren gel with good results.  Reports not sleeping well. Usually gets in bed around 11-11:30 p.m.  Turns of all lights and sounds once in bed.  Takes 20-30 mins to fall asleep. Wakes up b/w 3-4 a.m and finds it hard to fall back asleep; eventually drifts off but wakes up again.  Gets up b/w 7-7:30 a.m.  Over all he thinks he gets about 6 hrs.  Denies feeling groggy or un-refreshed.  Sometimes he takes a nap during the day in his recliner Patient Active Problem List   Diagnosis Date  Noted   Elevated PSA 05/19/2020   Erectile dysfunction 05/18/2020   Primary osteoarthritis of left shoulder 01/16/2020   Acute pain of left shoulder 10/22/2018   Over weight 04/02/2018   Adenomatous polyp of colon 03/02/2017   Essential hypertension 09/18/2016     Current Outpatient Medications on File Prior to Visit  Medication Sig Dispense Refill   amLODipine (NORVASC) 10 MG tablet TAKE 1 TABLET (10 MG TOTAL) BY MOUTH DAILY. 90 tablet 1   diclofenac Sodium (VOLTAREN) 1 % GEL Apply 2 grams topically 4 (four) times daily. 100 g 5   lisinopril (ZESTRIL) 10 MG tablet TAKE 1 & 1/2 TABLETS (15 MG TOTAL) BY MOUTH DAILY. 135 tablet 1   naproxen (NAPROSYN) 375 MG tablet Take 1 tablet (375 mg total) by mouth 2 (two) times daily with a meal. 40 tablet 1   sildenafil (VIAGRA) 50 MG tablet TAKE 1 TABLET BY MOUTH 1/2 HR PRIOR TO SEX AS NEEDED (1 TABLET PER 24 HR) 10 tablet 0   traMADol (ULTRAM) 50 MG tablet Take 1 tablet (50 mg total) by mouth daily as needed for moderate pain. 30 tablet 0   No current facility-administered medications on file prior to visit.    No Known Allergies  Social History   Socioeconomic History   Marital status: Married    Spouse name: Not on file   Number of children: Not on file   Years of education: Not on file   Highest education level: 9th grade  Occupational History   Not on file  Tobacco Use   Smoking status: Never   Smokeless tobacco: Never  Vaping Use   Vaping Use: Never used  Substance and Sexual Activity   Alcohol use: Yes    Comment: ocasionally   Drug use: No   Sexual activity: Not on file  Other Topics Concern   Not on file  Social History Narrative   Not on file   Social Determinants of Health   Financial Resource Strain: Low Risk  (06/08/2020)   Overall Financial Resource Strain (CARDIA)    Difficulty of Paying Living Expenses: Not very hard  Food Insecurity: No Food Insecurity (06/08/2020)   Hunger Vital Sign    Worried About  Running Out of Food in the Last Year: Never true    Ran Out of Food in the Last Year: Never true  Transportation Needs: No Transportation Needs (06/08/2020)   PRAPARE - Hydrologist (Medical): No    Lack of Transportation (Non-Medical): No  Physical Activity: Insufficiently Active (06/08/2020)   Exercise Vital Sign    Days of Exercise per Week: 3 days    Minutes of Exercise per Session: 30 min  Stress: No Stress Concern Present (06/08/2020)   [place]    Feeling of Stress : Not at all  Social Connections: Socially Isolated (06/08/2020)   Social Connection and Isolation Panel [NHANES]    Frequency of Communication with Friends and Family: Never    Frequency of Social Gatherings with Friends and Family: Never    Attends Religious Services: Never    Marine scientist or Organizations: No    Attends Archivist Meetings: Never    Marital Status: Married  Human resources officer Violence: Not At Risk (06/08/2020)   Humiliation, Afraid, Rape, and Kick questionnaire    Fear of Current or Ex-Partner: No    Emotionally Abused: No    Physically Abused: No    Sexually Abused: No    Family History  Problem Relation Age of Onset   Diabetes Brother    Hypertension Brother    Asthma Neg Hx    Cancer Neg Hx    Heart failure Neg Hx    Hyperlipidemia Neg Hx    Colon cancer Neg Hx    Colon polyps Neg Hx    Esophageal cancer Neg Hx    Stomach cancer Neg Hx    Rectal cancer Neg Hx     Past Surgical History:  Procedure Laterality Date   COLONOSCOPY     ORIF FEMUR FRACTURE  1970s   POLYPECTOMY      ROS: Review of Systems Negative except as stated above  PHYSICAL EXAM: BP 110/73 (BP Location: Left Arm, Patient Position: Sitting, Cuff Size: Normal)   Pulse 80   Temp 97.7 F (36.5 C) (Oral)   Ht '5\' 8"'$  (1.727 m)   Wt 158 lb (71.7 kg)   SpO2 98%   BMI 24.02 kg/m   Wt Readings from  Last 3 Encounters:  02/07/22 158 lb (71.7 kg)  10/04/21 166 lb (75.3 kg)  06/03/21 171 lb 2 oz (77.6 kg)    Physical Exam   General appearance - alert,  well appearing, and in no distress Mental status - normal mood, behavior, speech, dress, motor activity, and thought processes Neck - supple, no significant adenopathy Chest - clear to auscultation, no wheezes, rales or rhonchi, symmetric air entry Heart - normal rate, regular rhythm, normal S1, S2, no murmurs, rubs, clicks or gallops Extremities - peripheral pulses normal, no pedal edema, no clubbing or cyanosis     Latest Ref Rng & Units 06/03/2021    2:52 PM 05/18/2020   11:12 AM 04/07/2019   10:50 AM  CMP  Glucose 70 - 99 mg/dL 81  89  84   BUN 8 - 27 mg/dL '10  10  11   '$ Creatinine 0.76 - 1.27 mg/dL 0.87  0.93  0.99   Sodium 134 - 144 mmol/L 136  140  140   Potassium 3.5 - 5.2 mmol/L 4.6  4.1  4.1   Chloride 96 - 106 mmol/L 100  103  103   CO2 20 - 29 mmol/L '25  22  23   '$ Calcium 8.6 - 10.2 mg/dL 9.5  9.4  9.6   Total Protein 6.0 - 8.5 g/dL 7.7  7.1  7.2   Total Bilirubin 0.0 - 1.2 mg/dL 0.4  0.8  0.7   Alkaline Phos 44 - 121 IU/L 61  64  70   AST 0 - 40 IU/L '17  17  14   '$ ALT 0 - 44 IU/L '11  14  14    '$ Lipid Panel     Component Value Date/Time   CHOL 131 06/03/2021 1452   TRIG 40 06/03/2021 1452   HDL 46 06/03/2021 1452   CHOLHDL 2.8 06/03/2021 1452   LDLCALC 75 06/03/2021 1452    CBC    Component Value Date/Time   WBC 4.5 06/03/2021 1452   RBC 4.99 06/03/2021 1452   HGB 14.3 06/03/2021 1452   HCT 42.1 06/03/2021 1452   PLT 183 06/03/2021 1452   MCV 84 06/03/2021 1452   MCH 28.7 06/03/2021 1452   MCHC 34.0 06/03/2021 1452   RDW 13.8 06/03/2021 1452    ASSESSMENT AND PLAN:  1. Essential hypertension At goal.  Continue amlodipine and lisinopril. - CBC - Comprehensive metabolic panel - amLODipine (NORVASC) 10 MG tablet; TAKE 1 TABLET (10 MG TOTAL) BY MOUTH DAILY.  Dispense: 90 tablet; Refill: 1 -  lisinopril (ZESTRIL) 10 MG tablet; TAKE 1 & 1/2 TABLETS (15 MG TOTAL) BY MOUTH DAILY.  Dispense: 135 tablet; Refill: 1  2. Primary osteoarthritis of both shoulders Continue Voltaren gel as needed.  3. Insomnia, unspecified type Good sleep hygiene discussed and encouraged. Patient advised not to drink any caffeinated beverages or excessive alcohol use within several hours of bedtime.  Advised to get in bed around about the same time every night.  Once in bed, turn off all lights and sounds.  If unable to fall asleep within 30 to 45 minutes of getting in bed, patient should get up and try to do something until he feels sleepy again.  At that time try getting back in bed. -We discussed trying him on low-dose of trazodone.  Patient agreeable to trying it.  I advised that if the medication makes him feels drowsy or hung over in the mornings that he should stop the medicine and let me know. - traZODone (DESYREL) 50 MG tablet; Take 0.5 tablets (25 mg total) by mouth at bedtime.  Dispense: 30 tablet; Refill: 3  4. Unintended weight loss Up-to-date with age-appropriate cancer screenings. It seems  that it may be due to decreased oral intake.  Encouraged him to get in his 3 meals a day. - CBC - Comprehensive metabolic panel - MCR+[id number]  5. Elevated PSA - PSA  6. Need for influenza vaccination - Flu Vaccine QUAD High Dose(Fluad)   Patient was given the opportunity to ask questions.  Patient verbalized understanding of the plan and was able to repeat key elements of the plan.   This documentation was completed using Radio producer.  Any transcriptional errors are unintentional.  No orders of the defined types were placed in this encounter.    Requested Prescriptions    No prescriptions requested or ordered in this encounter    No follow-ups on file.  Karle Plumber, MD, FACP

## 2022-02-08 LAB — COMPREHENSIVE METABOLIC PANEL
ALT: 11 IU/L (ref 0–44)
AST: 18 IU/L (ref 0–40)
Albumin/Globulin Ratio: 1.7 (ref 1.2–2.2)
Albumin: 4.7 g/dL (ref 3.9–4.9)
Alkaline Phosphatase: 61 IU/L (ref 44–121)
BUN/Creatinine Ratio: 14 (ref 10–24)
BUN: 13 mg/dL (ref 8–27)
Bilirubin Total: 0.4 mg/dL (ref 0.0–1.2)
CO2: 28 mmol/L (ref 20–29)
Calcium: 9.6 mg/dL (ref 8.6–10.2)
Chloride: 100 mmol/L (ref 96–106)
Creatinine, Ser: 0.91 mg/dL (ref 0.76–1.27)
Globulin, Total: 2.7 g/dL (ref 1.5–4.5)
Glucose: 83 mg/dL (ref 70–99)
Potassium: 4.5 mmol/L (ref 3.5–5.2)
Sodium: 138 mmol/L (ref 134–144)
Total Protein: 7.4 g/dL (ref 6.0–8.5)
eGFR: 91 mL/min/{1.73_m2} (ref >59)

## 2022-02-08 LAB — CBC
Hematocrit: 39 % (ref 37.5–51.0)
Hemoglobin: 13.6 g/dL (ref 13.0–17.7)
MCH: 30.3 pg (ref 26.6–33.0)
MCHC: 34.9 g/dL (ref 31.5–35.7)
MCV: 87 fL (ref 79–97)
Platelets: 207 10*3/uL (ref 150–450)
RBC: 4.49 x10E6/uL (ref 4.14–5.80)
RDW: 13 % (ref 11.6–15.4)
WBC: 4.3 10*3/uL (ref 3.4–10.8)

## 2022-02-08 LAB — TSH+T4F+T3FREE
Free T4: 1.24 ng/dL (ref 0.82–1.77)
T3, Free: 3.2 pg/mL (ref 2.0–4.4)
TSH: 0.617 u[IU]/mL (ref 0.450–4.500)

## 2022-02-08 LAB — PSA: Prostate Specific Ag, Serum: 6.1 ng/mL — ABNORMAL HIGH (ref 0.0–4.0)

## 2022-03-16 ENCOUNTER — Other Ambulatory Visit: Payer: Self-pay

## 2022-03-17 ENCOUNTER — Other Ambulatory Visit: Payer: Self-pay

## 2022-03-17 ENCOUNTER — Encounter: Payer: Medicare Other | Admitting: Internal Medicine

## 2022-03-23 ENCOUNTER — Other Ambulatory Visit: Payer: Self-pay

## 2022-04-03 ENCOUNTER — Ambulatory Visit: Payer: Medicare Other | Attending: Internal Medicine | Admitting: Internal Medicine

## 2022-04-03 ENCOUNTER — Other Ambulatory Visit: Payer: Self-pay

## 2022-04-03 VITALS — BP 116/68 | HR 77 | Temp 98.3°F | Ht 68.0 in | Wt 162.0 lb

## 2022-04-03 DIAGNOSIS — Z0000 Encounter for general adult medical examination without abnormal findings: Secondary | ICD-10-CM

## 2022-04-03 DIAGNOSIS — Z2911 Encounter for prophylactic immunotherapy for respiratory syncytial virus (RSV): Secondary | ICD-10-CM

## 2022-04-03 DIAGNOSIS — M19011 Primary osteoarthritis, right shoulder: Secondary | ICD-10-CM | POA: Diagnosis not present

## 2022-04-03 DIAGNOSIS — Z23 Encounter for immunization: Secondary | ICD-10-CM

## 2022-04-03 DIAGNOSIS — R972 Elevated prostate specific antigen [PSA]: Secondary | ICD-10-CM | POA: Diagnosis not present

## 2022-04-03 MED ORDER — RSVPREF3 VAC RECOMB ADJUVANTED 120 MCG/0.5ML IM SUSR: 0.5000 mL | Freq: Once | INTRAMUSCULAR | 0 refills | Status: AC

## 2022-04-03 MED ORDER — MELOXICAM 15 MG PO TABS
15.0000 mg | ORAL_TABLET | Freq: Every day | ORAL | 3 refills | Status: AC
  Filled 2022-04-03: qty 30, 30d supply, fill #0

## 2022-04-03 NOTE — Patient Instructions (Signed)
I have sent the scription to your pharmacy for an arthritis medication called meloxicam 15 mg to take once daily for your shoulder.  I have given you the prescription for the RSV vaccine.  Please take this to the pharmacy downstairs so that they can fill the prescription and give you the vaccine today.

## 2022-04-03 NOTE — Progress Notes (Signed)
Subjective:   Benjamin Nunez is a 72 y.o. male who presents for Medicare Annual/Subsequent preventive examination. HTN, adenomatous colon polyps, OA shoulders, ED.    Review of Systems    MSK: Reports flareup of pain in his right shoulder.  He has known arthritis in both shoulders.  Naprosyn is on med list but patient states he has not been taking that.  He uses Voltaren gel sometimes when needed. GU: Went over results of PSA with him on from last visit.  It was still elevated but had decreased from previous level.  Previous level was 7, this time it was 6.1.  Patient tells me he has an appointment coming up with the urologist on the 22nd of this month.  Passing his urine okay.       Objective:    Today's Vitals   04/03/22 1123  BP: 116/68  Pulse: 77  Temp: 98.3 F (36.8 C)  TempSrc: Oral  SpO2: 99%  Weight: 162 lb (73.5 kg)  Height: '5\' 8"'$  (1.727 m)  PainSc: 10-Worst pain ever   Body mass index is 24.63 kg/m. Wt Readings from Last 3 Encounters:  04/03/22 162 lb (73.5 kg)  02/07/22 158 lb (71.7 kg)  10/04/21 166 lb (75.3 kg)        06/08/2020    3:42 PM 08/15/2019   10:37 AM 10/24/2017   11:21 AM 11/08/2016    1:30 PM 09/18/2016    3:33 PM  Advanced Directives  Does Patient Have a Medical Advance Directive? No No No No No  Would patient like information on creating a medical advance directive? No - Patient declined No - Patient declined No - Patient declined  No - Patient declined    Current Medications (verified) Outpatient Encounter Medications as of 04/03/2022  Medication Sig   amLODipine (NORVASC) 10 MG tablet TAKE 1 TABLET (10 MG TOTAL) BY MOUTH DAILY.   diclofenac Sodium (VOLTAREN) 1 % GEL Apply 2 grams topically 4 (four) times daily.   lisinopril (ZESTRIL) 10 MG tablet TAKE 1 & 1/2 TABLETS (15 MG TOTAL) BY MOUTH DAILY.   traZODone (DESYREL) 50 MG tablet Take 0.5 tablets (25 mg total) by mouth at bedtime.   naproxen (NAPROSYN) 375 MG tablet Take 1 tablet (375  mg total) by mouth 2 (two) times daily with a meal. (Patient not taking: Reported on 04/03/2022)   sildenafil (VIAGRA) 50 MG tablet TAKE 1 TABLET BY MOUTH 1/2 HR PRIOR TO SEX AS NEEDED (1 TABLET PER 24 HR) (Patient not taking: Reported on 04/03/2022)   No facility-administered encounter medications on file as of 04/03/2022.    Allergies (verified) Patient has no known allergies.   History: Past Medical History:  Diagnosis Date   Hypertension    Sickle cell trait ([place])    Past Surgical History:  Procedure Laterality Date   COLONOSCOPY     ORIF FEMUR FRACTURE  1970s   POLYPECTOMY     Family History  Problem Relation Age of Onset   Diabetes Brother    Hypertension Brother    Asthma Neg Hx    Cancer Neg Hx    Heart failure Neg Hx    Hyperlipidemia Neg Hx    Colon cancer Neg Hx    Colon polyps Neg Hx    Esophageal cancer Neg Hx    Stomach cancer Neg Hx    Rectal cancer Neg Hx    Social History   Socioeconomic History   Marital status: Married    Spouse name: Not  on file   Number of children: Not on file   Years of education: Not on file   Highest education level: 9th grade  Occupational History   Not on file  Tobacco Use   Smoking status: Never   Smokeless tobacco: Never  Vaping Use   Vaping Use: Never used  Substance and Sexual Activity   Alcohol use: Yes    Comment: ocasionally   Drug use: No   Sexual activity: Not on file  Other Topics Concern   Not on file  Social History Narrative   Not on file   Social Determinants of Health   Financial Resource Strain: Low Risk  (06/08/2020)   Overall Financial Resource Strain (CARDIA)    Difficulty of Paying Living Expenses: Not very hard  Food Insecurity: No Food Insecurity (06/08/2020)   Hunger Vital Sign    Worried About Running Out of Food in the Last Year: Never true    Ran Out of Food in the Last Year: Never true  Transportation Needs: No Transportation Needs (06/08/2020)   PRAPARE - Radiographer, therapeutic (Medical): No    Lack of Transportation (Non-Medical): No  Physical Activity: Insufficiently Active (06/08/2020)   Exercise Vital Sign    Days of Exercise per Week: 3 days    Minutes of Exercise per Session: 30 min  Stress: No Stress Concern Present (06/08/2020)   [place]    Feeling of Stress : Not at all  Social Connections: Socially Isolated (06/08/2020)   Social Connection and Isolation Panel [NHANES]    Frequency of Communication with Friends and Family: Never    Frequency of Social Gatherings with Friends and Family: Never    Attends Religious Services: Never    Marine scientist or Organizations: No    Attends Archivist Meetings: Never    Marital Status: Married    Tobacco Counseling Patient is not a smoker.  Clinical Intake:     Pain : 0-10 Pain Score: 10-Worst pain ever Pain Location: Shoulder Pain Orientation: Right Pain Descriptors / Indicators: Aching (pinching) Pain Onset: More than a month ago Pain Frequency: Constant     Diabetes: No    Diabetic?  Interpreter Needed?: No      Activities of Daily Living     No data to display           Patient Care Team: Ladell Pier, MD as PCP - General (Internal Medicine)  Indicate any recent Medical Services you may have received from other than [facility] providers in the past year (date may be approximate). Urologist: Dr. Jeffie Pollock    Assessment:   This is a routine wellness examination for Benjamin Nunez.  Hearing/Vision screen No results found.  Dietary issues and exercise activities discussed:   Doing well with eating habits.  Limits salt in diet. Eat a lot of fruits  Goals Addressed   None   Depression Screen    04/03/2022   11:28 AM 02/07/2022    1:44 PM 10/04/2021    3:02 PM 01/17/2021   11:35 AM 06/08/2020    3:43 PM 06/08/2020    3:40 PM 05/18/2020   10:32 AM  PHQ 2/9 Scores  PHQ - 2 Score 0 0 0 0  0 0 0  PHQ- 9 Score 0 4 4        Fall Risk    04/03/2022   11:24 AM 02/07/2022    1:40 PM  10/04/2021    3:01 PM 09/16/2020   10:18 AM 06/08/2020    3:42 PM  [place] in the past year? 0 0 0 0 0  Number falls in past yr: 0 0 0 0 0  Injury with Fall? 0 0 0 0 0  Risk for fall due to : No Fall Risks No Fall Risks No Fall Risks No Fall Risks   Follow up   Falls evaluation completed  Falls evaluation completed;Education provided;Falls prevention discussed    FALL RISK PREVENTION PERTAINING TO THE HOME:  Any stairs in or around the home? Yes  If so, are there any without handrails? Yes  Home free of loose throw rugs in walkways, pet beds, electrical cords, etc? Yes  Adequate lighting in your home to reduce risk of falls? Yes   ASSISTIVE DEVICES UTILIZED TO PREVENT FALLS:  Life alert? No  Use of a cane, walker or w/c? No  Grab bars in the bathroom? No  Shower chair or bench in shower? No  Elevated toilet seat or a handicapped toilet? No   TIMED UP AND GO:  Was the test performed? Yes .  Length of time to ambulate 10 feet: 7 sec.   Gait steady and fast without use of assistive device  Cognitive Function:    06/08/2020    3:59 PM 08/15/2019   10:39 AM 10/24/2017   11:24 AM  MMSE - Mini Mental State Exam  Orientation to time '5 5 4  '$ Orientation to time comments   Patient misidentified year - Gave 2020  Orientation to Place '5 3 5  '$ Registration '3 3 3  '$ Attention/ Calculation '5 5 3  '$ Attention/Calculation-comments   Was able to count backwards from 100 by 5s but not by 7. Additionally, not able to spelll "WORLD" forwards or backwards.  Recall 1 3 0  Recall-comments   Immediate recall intact. Delayed recall 0/3.  Language- name 2 objects '2 2 2  '$ Language- repeat '1 1 1  '$ Language- follow 3 step command '3 3 3  '$ Language- read & follow direction 1 0 1  Write a sentence 1 0 0  Copy design '1 1 1  '$ Total score '28 26 23        '$ Immunizations Immunization History  Administered  Date(s) Administered   Fluad Quad(high Dose 65+) 02/07/2022   Influenza,inj,Quad PF,6+ Mos 01/16/2018, 01/24/2019, 01/16/2020, 01/27/2021   PFIZER Comirnaty(Gray Top)Covid-19 Tri-Sucrose Vaccine 05/21/2020   PFIZER(Purple Top)SARS-COV-2 Vaccination 07/10/2019, 08/04/2019   Tdap 06/08/2020    TDAP status: Up to date  Flu Vaccine status: Up to date  Pneumococcal vaccine status: Completed during today's visit.  Covid-19 vaccine status: Information provided on how to obtain vaccines.   Qualifies for Shingles Vaccine? Yes   Zostavax completed No   Shingrix Completed?: No.    Education has been provided regarding the importance of this vaccine. Patient has been advised to call insurance company to determine out of pocket expense if they have not yet received this vaccine. Advised may also receive vaccine at local pharmacy or Health Dept. Verbalized acceptance and understanding.  Screening Tests Health Maintenance  Topic Date Due   Pneumonia Vaccine 46+ Years old (1 - PCV) Never done   Medicare Annual Wellness (AWV)  06/08/2021   COVID-19 Vaccine (4 - 2023-24 season) 11/25/2021   Zoster Vaccines- Shingrix (1 of 2) 01/05/2023 (Originally 10/12/2001)   COLONOSCOPY (Pts 45-69yr Insurance coverage will need to be confirmed)  04/23/2027   DTaP/Tdap/Td (2 - Td  or Tdap) 06/09/2030   INFLUENZA VACCINE  Completed   Hepatitis C Screening  Completed   HPV VACCINES  Aged Out    Health Maintenance  Health Maintenance Due  Topic Date Due   Pneumonia Vaccine 2+ Years old (1 - PCV) Never done   Medicare Annual Wellness (AWV)  06/08/2021   COVID-19 Vaccine (4 - 2023-24 season) 11/25/2021    Colorectal cancer screening: Type of screening: Colonoscopy. Completed 04/22/2020. Repeat every 7 years  Lung Cancer Screening: (Low Dose CT Chest recommended if Age 26-80 years, 30 pack-year currently smoking OR have quit w/in 15years.) does not qualify.   Lung Cancer Screening Referral: NA  Additional  Screening:  Hepatitis C Screening: does qualify; Completed 07/2017  Vision Screening: Recommended annual ophthalmology exams for early detection of glaucoma and other disorders of the eye. Is the patient up to date with their annual eye exam?  Yes  Who is the provider or what is the name of the office in which the patient attends annual eye exams? Dr. Katy Fitch.  Seen 2 mths ago If pt is not established with a provider, would they like to be referred to a provider to establish care?  Dr. Katy Fitch .   Dental Screening: Recommended annual dental exams for proper oral hygiene. Has dentures  Community Resource Referral / Chronic Care Management: CRR required this visit?  No   CCM required this visit?  No      Plan:    1. Encounter for Medicare annual wellness exam   2. Primary osteoarthritis of right shoulder Stop Naprosyn. Discussed referral to orthopedics.  Patient is not interested in any surgical option or steroid injection.  We will try him with meloxicam. - meloxicam (MOBIC) 15 MG tablet; Take 1 tablet (15 mg total) by mouth daily. For arthritis pain  Dispense: 30 tablet; Refill: 3  3. Elevated PSA Patient has appointment with his urologist on the 22nd of this month.  Advised him to please have Dr. Jeffie Pollock send me his note  4. Need for vaccination against Streptococcus pneumoniae - Pneumococcal conjugate vaccine 20-valent  5. Need for RSV vaccination Discussed recommendation for RSV vaccine.  Patient is agreeable to getting the vaccine.  I have printed the prescription and given to him to take to his outside pharmacy to get this vaccine completed. - RSV vaccine recomb adjuvanted (AREXVY) 120 MCG/0.5ML injection; Inject 0.5 mLs into the muscle once for 1 dose.  Dispense: 0.5 mL; Refill: 0  I have personally reviewed and noted the following in the patient's chart:   Medical and social history Use of alcohol, tobacco or illicit drugs  Current medications and supplements including  opioid prescriptions. Patient is not currently taking opioid prescriptions. Functional ability and status Nutritional status Physical activity Advanced directives List of other physicians Hospitalizations, surgeries, and ER visits in previous 12 months Vitals Screenings to include cognitive, depression, and falls Referrals and appointments  In addition, I have reviewed and discussed with patient certain preventive protocols, quality metrics, and best practice recommendations. A written personalized care plan for preventive services as well as general preventive health recommendations were provided to patient.     Karle Plumber, MD   04/03/2022

## 2022-04-21 ENCOUNTER — Other Ambulatory Visit: Payer: Self-pay

## 2022-05-25 ENCOUNTER — Other Ambulatory Visit: Payer: Self-pay

## 2022-07-05 ENCOUNTER — Other Ambulatory Visit (HOSPITAL_COMMUNITY): Payer: Self-pay

## 2022-07-05 ENCOUNTER — Other Ambulatory Visit: Payer: Self-pay

## 2022-07-28 DIAGNOSIS — R3912 Poor urinary stream: Secondary | ICD-10-CM | POA: Diagnosis not present

## 2022-08-02 ENCOUNTER — Encounter: Payer: Self-pay | Admitting: Internal Medicine

## 2022-08-02 NOTE — Progress Notes (Signed)
Note received from [facility].  Patient was seen 07/28/2022.  Patient is followed by Dr. Annabell Howells.  He had prostate biopsy 10/2020 which showed atypia on 1 core.  Patient reported some hematospermia. 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  Patient was reassured that this is typically benign but with his history we will have him return in 6 months with a PSA instead of 1 year.

## 2022-08-04 ENCOUNTER — Other Ambulatory Visit: Payer: Self-pay

## 2022-08-04 ENCOUNTER — Ambulatory Visit: Payer: Medicare Other | Attending: Internal Medicine | Admitting: Internal Medicine

## 2022-08-04 ENCOUNTER — Encounter: Payer: Self-pay | Admitting: Internal Medicine

## 2022-08-04 VITALS — BP 119/71 | HR 71 | Temp 98.1°F | Ht 68.0 in | Wt 166.0 lb

## 2022-08-04 DIAGNOSIS — I10 Essential (primary) hypertension: Secondary | ICD-10-CM

## 2022-08-04 DIAGNOSIS — E663 Overweight: Secondary | ICD-10-CM

## 2022-08-04 MED ORDER — LISINOPRIL 10 MG PO TABS
15.0000 mg | ORAL_TABLET | Freq: Every day | ORAL | 1 refills | Status: DC
  Filled 2022-08-04: qty 135, 90d supply, fill #0
  Filled 2022-08-04: qty 135, fill #0
  Filled 2022-08-29 (×2): qty 135, 90d supply, fill #0
  Filled 2023-01-03: qty 135, 90d supply, fill #1

## 2022-08-04 MED ORDER — AMLODIPINE BESYLATE 10 MG PO TABS
10.0000 mg | ORAL_TABLET | Freq: Every day | ORAL | 1 refills | Status: DC
  Filled 2022-08-04: qty 90, fill #0
  Filled 2022-08-04 – 2022-09-11 (×3): qty 90, 90d supply, fill #0
  Filled 2023-01-03: qty 90, 90d supply, fill #1

## 2022-08-04 NOTE — Patient Instructions (Signed)

## 2022-08-04 NOTE — Progress Notes (Signed)
Patient ID: Benjamin Nunez, male    DOB: [birth year]  MRN: [id number]  CC: Hypertension (HTN f/u. /No questions / concerns )   Subjective: Benjamin Nunez is a 72 y.o. male who presents for chronic ds management His concerns today include:  Pt with hx of HTN, adenomatous colon polyps, OA shoulders, ED.    HYPERTENSION Currently taking: see medication list: Patient is on Norvasc 10 mg daily and lisinopril 15 mg daily Med Adherence: [x]  Yes    []  No Medication side effects: []  Yes    [x]  No Adherence with salt restriction: [x]  Yes    []  No Home Monitoring?: [x]  Yes  Monitoring Frequency: occasionally Home BP results range:  SOB? []  Yes    [x]  No Chest Pain?: []  Yes    [x]  No Leg swelling?: []  Yes    [x]  No Headaches?: []  Yes    [x]  No Dizziness? []  Yes    [x]  No Comments:    Wgh up 8lbs since 01/2022.  Reports he needs snack on chips, popcorn cakes.  Drinks water and Sprite.  Walks about 3x/wk for 1 hr. not wanting to lose any weight.  Feels good at the current weight that he is at.  BMI is 25.  Elevated PSA: He is seen Dr. Annabell Howells 07/28/2022 in f/u.  He is being monitored.  He had prostate biopsy done in August 2022 which revealed atypia on 1 core.  He has not been bothered much by arthritis in his shoulders as much over the past several weeks.  Uses Voltaren gel with good results. 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 90 tablet 1   diclofenac Sodium (VOLTAREN) 1 % GEL Apply 2 grams topically 4 (four) times daily. 100 g 5   lisinopril  (ZESTRIL) 10 MG tablet TAKE 1 & 1/2 TABLETS (15 MG TOTAL) BY MOUTH DAILY. 135 tablet 1   meloxicam (MOBIC) 15 MG tablet Take 1 tablet (15 mg total) by mouth daily. For arthritis pain 30 tablet 3   traZODone (DESYREL) 50 MG tablet Take 0.5 tablets (25 mg total) by mouth at bedtime. 30 tablet 3   sildenafil (VIAGRA) 50 MG tablet TAKE 1 TABLET BY MOUTH 1/2 HR PRIOR TO SEX AS NEEDED (1 TABLET PER 24 HR) (Patient not taking: Reported on 04/03/2022) 10 tablet 0   No current facility-administered medications on file prior to visit.    No Known Allergies  Social History   Socioeconomic History   Marital status: Married    Spouse name: Not on file   Number of children: Not on file   Years of education: Not on file   Highest education level: 9th grade  Occupational History   Not on file  Tobacco Use   Smoking status: Never   Smokeless tobacco: Never  Vaping Use   Vaping Use: Never used  Substance and Sexual Activity   Alcohol use: Yes    Comment: ocasionally   Drug use: No   Sexual activity: Not on file  Other Topics Concern   Not on  file  Social History Narrative   Not on file   Social Determinants of Health   Financial Resource Strain: Low Risk  (06/08/2020)   Overall Financial Resource Strain (CARDIA)    Difficulty of Paying Living Expenses: Not very hard  Food Insecurity: No Food Insecurity (06/08/2020)   Hunger Vital Sign    Worried About Running Out of Food in the Last Year: Never true    Ran Out of Food in the Last Year: Never true  Transportation Needs: No Transportation Needs (06/08/2020)   PRAPARE - Administrator, Civil Service (Medical): No    Lack of Transportation (Non-Medical): No  Physical Activity: Insufficiently Active (06/08/2020)   Exercise Vital Sign    Days of Exercise per Week: 3 days    Minutes of Exercise per Session: 30 min  Stress: No Stress Concern Present (06/08/2020)   [employer] of Occupational Health - Occupational Stress  Questionnaire    Feeling of Stress : Not at all  Social Connections: Socially Isolated (06/08/2020)   Social Connection and Isolation Panel [NHANES]    Frequency of Communication with Friends and Family: Never    Frequency of Social Gatherings with Friends and Family: Never    Attends Religious Services: Never    Database administrator or Organizations: No    Attends Banker Meetings: Never    Marital Status: Married  Catering manager Violence: Not At Risk (06/08/2020)   Humiliation, Afraid, Rape, and Kick questionnaire    Fear of Current or Ex-Partner: No    Emotionally Abused: No    Physically Abused: No    Sexually Abused: No    Family History  Problem Relation Age of Onset   Diabetes Brother    Hypertension Brother    Asthma Neg Hx    Cancer Neg Hx    Heart failure Neg Hx    Hyperlipidemia Neg Hx    Colon cancer Neg Hx    Colon polyps Neg Hx    Esophageal cancer Neg Hx    Stomach cancer Neg Hx    Rectal cancer Neg Hx     Past Surgical History:  Procedure Laterality Date   COLONOSCOPY     ORIF FEMUR FRACTURE  1970s   POLYPECTOMY      ROS: Review of Systems Negative except as stated above  PHYSICAL EXAM: BP 119/71 (BP Location: Left Arm, Patient Position: Sitting, Cuff Size: Normal)   Pulse 71   Temp 98.1 F (36.7 C) (Oral)   Ht 5\' 8"  (1.727 m)   Wt 166 lb (75.3 kg)   SpO2 98%   BMI 25.24 kg/m   Wt Readings from Last 3 Encounters:  08/04/22 166 lb (75.3 kg)  04/03/22 162 lb (73.5 kg)  02/07/22 158 lb (71.7 kg)    Physical Exam General appearance - alert, well appearing, older African-American male and in no distress Mental status - normal mood, behavior, speech, dress, motor activity, and thought processes Chest - clear to auscultation, no wheezes, rales or rhonchi, symmetric air entry Heart - normal rate, regular rhythm, normal S1, S2, no murmurs, rubs, clicks or gallops Extremities - peripheral pulses normal, no pedal edema, no clubbing  or cyanosis     Latest Ref Rng & Units 02/07/2022    2:39 PM 06/03/2021    2:52 PM 05/18/2020   11:12 AM  CMP  Glucose 70 - 99 mg/dL 83  81  89   BUN 8 - 27 mg/dL 13  10  10   Creatinine 0.76 - 1.27 mg/dL 9.60  4.54  0.98   Sodium 134 - 144 mmol/L 138  136  140   Potassium 3.5 - 5.2 mmol/L 4.5  4.6  4.1   Chloride 96 - 106 mmol/L 100  100  103   CO2 20 - 29 mmol/L 28  25  22    Calcium 8.6 - 10.2 mg/dL 9.6  9.5  9.4   Total Protein 6.0 - 8.5 g/dL 7.4  7.7  7.1   Total Bilirubin 0.0 - 1.2 mg/dL 0.4  0.4  0.8   Alkaline Phos 44 - 121 IU/L 61  61  64   AST 0 - 40 IU/L 18  17  17    ALT 0 - 44 IU/L 11  11  14     Lipid Panel     Component Value Date/Time   CHOL 131 06/03/2021 1452   TRIG 40 06/03/2021 1452   HDL 46 06/03/2021 1452   CHOLHDL 2.8 06/03/2021 1452   LDLCALC 75 06/03/2021 1452    CBC    Component Value Date/Time   WBC 4.3 02/07/2022 1439   RBC 4.49 02/07/2022 1439   HGB 13.6 02/07/2022 1439   HCT 39.0 02/07/2022 1439   PLT 207 02/07/2022 1439   MCV 87 02/07/2022 1439   MCH 30.3 02/07/2022 1439   MCHC 34.9 02/07/2022 1439   RDW 13.0 02/07/2022 1439    ASSESSMENT AND PLAN:  1. Essential hypertension At goal.  Continue lisinopril and amlodipine at current doses. - lisinopril (ZESTRIL) 10 MG tablet; Take 1.5 tablets (15 mg total) by mouth daily.  Dispense: 135 tablet; Refill: 1 - amLODipine (NORVASC) 10 MG tablet; Take 1 tablet (10 mg total) by mouth daily.  Dispense: 90 tablet; Refill: 1  2. Overweight (BMI 25.0-29.9) Commended him on regular exercise. Encouraged him to choose healthy snacks like fruits or lightly salted nuts instead of chips and cakes.  Recommend drinking more water instead of sodas.  Patient was given the opportunity to ask questions.  Patient verbalized understanding of the plan and was able to repeat key elements of the plan.   This documentation was completed using Paediatric nurse.  Any transcriptional errors are  unintentional.  No orders of the defined types were placed in this encounter.    Requested Prescriptions   Pending Prescriptions Disp Refills   lisinopril (ZESTRIL) 10 MG tablet 135 tablet 1    Sig: TAKE 1 & 1/2 TABLETS (15 MG TOTAL) BY MOUTH DAILY.   amLODipine (NORVASC) 10 MG tablet 90 tablet 1    Sig: TAKE 1 TABLET (10 MG TOTAL) BY MOUTH DAILY.    No follow-ups on file.  Jonah Blue, MD, FACP

## 2022-08-08 ENCOUNTER — Other Ambulatory Visit: Payer: Self-pay | Admitting: Urology

## 2022-08-08 DIAGNOSIS — N4232 Atypical small acinar proliferation of prostate: Secondary | ICD-10-CM

## 2022-08-08 DIAGNOSIS — R972 Elevated prostate specific antigen [PSA]: Secondary | ICD-10-CM

## 2022-08-29 ENCOUNTER — Other Ambulatory Visit: Payer: Self-pay

## 2022-09-03 ENCOUNTER — Ambulatory Visit
Admission: RE | Admit: 2022-09-03 | Discharge: 2022-09-03 | Disposition: A | Discharge status: Home / Self Care | Payer: Medicare Other | Source: Ambulatory Visit | Attending: Urology | Admitting: Urology

## 2022-09-03 DIAGNOSIS — R972 Elevated prostate specific antigen [PSA]: Secondary | ICD-10-CM

## 2022-09-03 DIAGNOSIS — N4232 Atypical small acinar proliferation of prostate: Secondary | ICD-10-CM

## 2022-09-03 MED ORDER — GADOPICLENOL 0.5 MMOL/ML IV SOLN
7.5000 mL | Freq: Once | INTRAVENOUS | Status: AC | PRN
  Administered 2022-09-03: 7.5 mL via INTRAVENOUS

## 2022-09-05 ENCOUNTER — Other Ambulatory Visit (HOSPITAL_COMMUNITY): Payer: Self-pay

## 2022-09-05 MED ORDER — LEVOFLOXACIN 750 MG PO TABS
750.0000 mg | ORAL_TABLET | ORAL | 0 refills | Status: DC
  Filled 2022-09-05: qty 1, 1d supply, fill #0

## 2022-09-06 ENCOUNTER — Other Ambulatory Visit (HOSPITAL_COMMUNITY): Payer: Self-pay

## 2022-09-11 ENCOUNTER — Other Ambulatory Visit: Payer: Self-pay

## 2022-10-27 DIAGNOSIS — R310 Gross hematuria: Secondary | ICD-10-CM | POA: Diagnosis not present

## 2022-11-01 ENCOUNTER — Encounter: Payer: Self-pay | Admitting: Internal Medicine

## 2022-11-01 DIAGNOSIS — C61 Malignant neoplasm of prostate: Secondary | ICD-10-CM | POA: Insufficient documentation

## 2022-12-05 ENCOUNTER — Encounter: Payer: Self-pay | Admitting: Internal Medicine

## 2022-12-05 ENCOUNTER — Ambulatory Visit: Payer: Medicare Other | Attending: Internal Medicine | Admitting: Internal Medicine

## 2022-12-05 VITALS — BP 111/66 | HR 68 | Temp 97.9°F | Ht 68.0 in | Wt 162.0 lb

## 2022-12-05 DIAGNOSIS — D649 Anemia, unspecified: Secondary | ICD-10-CM

## 2022-12-05 DIAGNOSIS — C61 Malignant neoplasm of prostate: Secondary | ICD-10-CM | POA: Diagnosis not present

## 2022-12-05 DIAGNOSIS — Z23 Encounter for immunization: Secondary | ICD-10-CM | POA: Diagnosis not present

## 2022-12-05 DIAGNOSIS — I10 Essential (primary) hypertension: Secondary | ICD-10-CM | POA: Diagnosis not present

## 2022-12-05 NOTE — Progress Notes (Signed)
Patient ID: Benjamin Nunez, male    DOB: 05/28/1950  MRN: [id number]  CC: Hypertension (HTN f/u. /No questions / concerns Valentino Hue to flu vax )   Subjective: Benjamin Nunez is a 72 y.o. male who presents for chronic ds management. His concerns today include:  Pt with hx of HTN, adenomatous colon polyps, OA shoulders, ED.     HYPERTENSION Currently taking: see medication list: Patient is on Norvasc 10 mg daily and lisinopril 15 mg daily Med Adherence: [x]  Yes    []  No Medication side effects: []  Yes    [x]  No Adherence with salt restriction: [x]  Yes    []  No Home Monitoring?: [x]  Yes, occasionally  No CP/SOB/LE edema/dizziness Still walking for exercise about 3x/wk about 1/2-1 mile in his neighborhood  Saw his urologist in June and had repeat prostate Bx.  Showed single core with cancer cells in LT apex with 5% involvement.  Patient and his provider decided on active surveillance.  Patient reports he is passing his urine okay.  No hematuria at this time.  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 90 tablet 1   diclofenac Sodium (VOLTAREN) 1 % GEL Apply 2 grams topically 4 (four) times daily. 100 g 5   lisinopril (ZESTRIL) 10 MG tablet Take 1.5 tablets (15 mg total) by mouth daily. 135 tablet 1   traZODone (DESYREL) 50 MG tablet Take 0.5 tablets (25 mg total) by mouth at bedtime. 30 tablet 3   levofloxacin (LEVAQUIN) 750 MG tablet Take 1 tablet (750 mg total) by mouth 1 hour prior to the  procedure (Patient not taking: Reported on 12/05/2022) 1 tablet 0   meloxicam (MOBIC) 15 MG tablet Take 1 tablet (15 mg total) by mouth daily. For arthritis pain (Patient not taking: Reported on 12/05/2022) 30 tablet 3   sildenafil (VIAGRA) 50 MG tablet TAKE 1 TABLET BY MOUTH 1/2 HR PRIOR TO SEX AS NEEDED (1 TABLET PER 24 HR) (Patient not taking: Reported on 04/03/2022) 10 tablet 0   No current facility-administered medications on file prior to visit.    No Known Allergies  Social History   Socioeconomic History   Marital status: Married    Spouse name: Not on file   Number of children: Not on file   Years of education: Not on file   Highest education level: 9th grade  Occupational History   Not on file  Tobacco Use   Smoking status: Never   Smokeless tobacco: Never  Vaping Use   Vaping status: Never Used  Substance and Sexual Activity   Alcohol use: Yes    Comment: ocasionally   Drug use: No   Sexual activity: Not on file  Other Topics Concern   Not on file  Social History Narrative   Not on file   Social Determinants of Health   Financial Resource Strain: Low Risk  (06/08/2020)   Overall Financial Resource Strain (CARDIA)    Difficulty of Paying Living Expenses: Not very  hard  Food Insecurity: No Food Insecurity (06/08/2020)   Hunger Vital Sign    Worried About Running Out of Food in the Last Year: Never true    Ran Out of Food in the Last Year: Never true  Transportation Needs: No Transportation Needs (06/08/2020)   PRAPARE - Administrator, Civil Service (Medical): No    Lack of Transportation (Non-Medical): No  Physical Activity: Insufficiently Active (06/08/2020)   Exercise Vital Sign    Days of Exercise per Week: 3 days    Minutes of Exercise per Session: 30 min  Stress: No Stress Concern Present (06/08/2020)   [employer] of Occupational Health - Occupational Stress Questionnaire    Feeling of Stress : Not at all  Social Connections: Socially  Isolated (06/08/2020)   Social Connection and Isolation Panel [NHANES]    Frequency of Communication with Friends and Family: Never    Frequency of Social Gatherings with Friends and Family: Never    Attends Religious Services: Never    Database administrator or Organizations: No    Attends Banker Meetings: Never    Marital Status: Married  Catering manager Violence: Not At Risk (06/08/2020)   Humiliation, Afraid, Rape, and Kick questionnaire    Fear of Current or Ex-Partner: No    Emotionally Abused: No    Physically Abused: No    Sexually Abused: No    Family History  Problem Relation Age of Onset   Diabetes Brother    Hypertension Brother    Asthma Neg Hx    Cancer Neg Hx    Heart failure Neg Hx    Hyperlipidemia Neg Hx    Colon cancer Neg Hx    Colon polyps Neg Hx    Esophageal cancer Neg Hx    Stomach cancer Neg Hx    Rectal cancer Neg Hx     Past Surgical History:  Procedure Laterality Date   COLONOSCOPY     ORIF FEMUR FRACTURE  1970s   POLYPECTOMY      ROS: Review of Systems Negative except as stated above  PHYSICAL EXAM: BP 111/66 (BP Location: Left Arm, Patient Position: Sitting, Cuff Size: Normal)   Pulse 68   Temp 97.9 F (36.6 C) (Oral)   Ht 5\' 8"  (1.727 m)   Wt 162 lb (73.5 kg)   SpO2 97%   BMI 24.63 kg/m   Wt Readings from Last 3 Encounters:  12/05/22 162 lb (73.5 kg)  08/04/22 166 lb (75.3 kg)  04/03/22 162 lb (73.5 kg)    Physical Exam   General appearance - alert, well appearing, elderly AAM and in no distress Mental status - normal mood, behavior, speech, dress, motor activity, and thought processes Neck - supple, no significant adenopathy Chest - clear to auscultation, no wheezes, rales or rhonchi, symmetric air entry Heart - normal rate, regular rhythm, normal S1, S2, no murmurs, rubs, clicks or gallops Extremities - peripheral pulses normal, no pedal edema, no clubbing or cyanosis     Latest Ref Rng & Units  02/07/2022    2:39 PM 06/03/2021    2:52 PM 05/18/2020   11:12 AM  CMP  Glucose 70 - 99 mg/dL 83  81  89   BUN 8 - 27 mg/dL 13  10  10    Creatinine 0.76 - 1.27 mg/dL 1.61  0.96  0.45   Sodium 134 - 144 mmol/L 138  136  140   Potassium 3.5 - 5.2 mmol/L 4.5  4.6  4.1   Chloride 96 - 106 mmol/L 100  100  103   CO2 20 - 29 mmol/L 28  25  22    Calcium 8.6 - 10.2 mg/dL 9.6  9.5  9.4   Total Protein 6.0 - 8.5 g/dL 7.4  7.7  7.1   Total Bilirubin 0.0 - 1.2 mg/dL 0.4  0.4  0.8   Alkaline Phos 44 - 121 IU/L 61  61  64   AST 0 - 40 IU/L 18  17  17    ALT 0 - 44 IU/L 11  11  14     Lipid Panel     Component Value Date/Time   CHOL 131 06/03/2021 1452   TRIG 40 06/03/2021 1452   HDL 46 06/03/2021 1452   CHOLHDL 2.8 06/03/2021 1452   LDLCALC 75 06/03/2021 1452    CBC    Component Value Date/Time   WBC 4.3 02/07/2022 1439   RBC 4.49 02/07/2022 1439   HGB 13.6 02/07/2022 1439   HCT 39.0 02/07/2022 1439   PLT 207 02/07/2022 1439   MCV 87 02/07/2022 1439   MCH 30.3 02/07/2022 1439   MCHC 34.9 02/07/2022 1439   RDW 13.0 02/07/2022 1439    ASSESSMENT AND PLAN: 1. Essential hypertension At goal.  Continue Lisinopril 15 mg and Norvasc 10 mg Continue - Comprehensive metabolic panel - CBC - Lipid panel  2. Prostate cancer (HCC) Chose active surveillance.  Followed by urology.  3. Encounter for immunization - Flu Vaccine Trivalent High Dose (Fluad)     Patient was given the opportunity to ask questions.  Patient verbalized understanding of the plan and was able to repeat key elements of the plan.   This documentation was completed using Paediatric nurse.  Any transcriptional errors are unintentional.  No orders of the defined types were placed in this encounter.    Requested Prescriptions    No prescriptions requested or ordered in this encounter    No follow-ups on file.  Jonah Blue, MD, FACP

## 2022-12-06 LAB — CBC
Hematocrit: 37.6 % (ref 37.5–51.0)
Hemoglobin: 12.9 g/dL — ABNORMAL LOW (ref 13.0–17.7)
MCH: 29.3 pg (ref 26.6–33.0)
MCHC: 34.3 g/dL (ref 31.5–35.7)
MCV: 86 fL (ref 79–97)
Platelets: 188 10*3/uL (ref 150–450)
RBC: 4.4 x10E6/uL (ref 4.14–5.80)
RDW: 13 % (ref 11.6–15.4)
WBC: 3.4 10*3/uL (ref 3.4–10.8)

## 2022-12-06 LAB — COMPREHENSIVE METABOLIC PANEL
ALT: 10 IU/L (ref 0–44)
AST: 14 IU/L (ref 0–40)
Albumin: 4.6 g/dL (ref 3.8–4.8)
Alkaline Phosphatase: 59 IU/L (ref 44–121)
BUN/Creatinine Ratio: 10 (ref 10–24)
BUN: 9 mg/dL (ref 8–27)
Bilirubin Total: 0.5 mg/dL (ref 0.0–1.2)
CO2: 24 mmol/L (ref 20–29)
Calcium: 9.3 mg/dL (ref 8.6–10.2)
Chloride: 102 mmol/L (ref 96–106)
Creatinine, Ser: 0.87 mg/dL (ref 0.76–1.27)
Globulin, Total: 2.7 g/dL (ref 1.5–4.5)
Glucose: 81 mg/dL (ref 70–99)
Potassium: 4.8 mmol/L (ref 3.5–5.2)
Sodium: 139 mmol/L (ref 134–144)
Total Protein: 7.3 g/dL (ref 6.0–8.5)
eGFR: 92 mL/min/{1.73_m2} (ref >59)

## 2022-12-06 LAB — LIPID PANEL
Chol/HDL Ratio: 3 ratio (ref 0.0–5.0)
Cholesterol, Total: 139 mg/dL (ref 100–199)
HDL: 46 mg/dL (ref >39)
LDL Chol Calc (NIH): 84 mg/dL (ref 0–99)
Triglycerides: 36 mg/dL (ref 0–149)
VLDL Cholesterol Cal: 9 mg/dL (ref 5–40)

## 2022-12-06 NOTE — Addendum Note (Signed)
Addended by: Jonah Blue B on: 12/06/2022 11:58 AM   Modules accepted: Orders

## 2022-12-28 LAB — IRON,TIBC AND FERRITIN PANEL
Ferritin: 219 ng/mL (ref 30–400)
Iron Saturation: 25 % (ref 15–55)
Iron: 67 ug/dL (ref 38–169)
Total Iron Binding Capacity: 272 ug/dL (ref 250–450)
UIBC: 205 ug/dL (ref 111–343)

## 2022-12-28 LAB — SPECIMEN STATUS REPORT

## 2023-01-03 ENCOUNTER — Other Ambulatory Visit: Payer: Self-pay

## 2023-02-07 DIAGNOSIS — R350 Frequency of micturition: Secondary | ICD-10-CM | POA: Diagnosis not present

## 2023-04-09 ENCOUNTER — Encounter: Payer: Self-pay | Admitting: Internal Medicine

## 2023-04-09 ENCOUNTER — Ambulatory Visit: Payer: Medicare Other | Attending: Internal Medicine | Admitting: Internal Medicine

## 2023-04-09 ENCOUNTER — Other Ambulatory Visit: Payer: Self-pay

## 2023-04-09 VITALS — BP 124/76 | HR 70 | Temp 97.5°F | Ht 68.0 in | Wt 163.0 lb

## 2023-04-09 DIAGNOSIS — I10 Essential (primary) hypertension: Secondary | ICD-10-CM | POA: Diagnosis not present

## 2023-04-09 DIAGNOSIS — R058 Other specified cough: Secondary | ICD-10-CM | POA: Diagnosis not present

## 2023-04-09 DIAGNOSIS — C61 Malignant neoplasm of prostate: Secondary | ICD-10-CM

## 2023-04-09 DIAGNOSIS — D170 Benign lipomatous neoplasm of skin and subcutaneous tissue of head, face and neck: Secondary | ICD-10-CM | POA: Diagnosis not present

## 2023-04-09 MED ORDER — BENZONATATE 100 MG PO CAPS
100.0000 mg | ORAL_CAPSULE | Freq: Two times a day (BID) | ORAL | 0 refills | Status: DC | PRN
Start: 2023-04-09 — End: 2023-08-13
  Filled 2023-04-09: qty 20, 10d supply, fill #0

## 2023-04-09 MED ORDER — AMLODIPINE BESYLATE 10 MG PO TABS
10.0000 mg | ORAL_TABLET | Freq: Every day | ORAL | 1 refills | Status: DC
  Filled 2023-04-09: qty 90, 90d supply, fill #0
  Filled 2023-07-31: qty 90, 90d supply, fill #1

## 2023-04-09 MED ORDER — LISINOPRIL 10 MG PO TABS
15.0000 mg | ORAL_TABLET | Freq: Every day | ORAL | 1 refills | Status: DC
Start: 2023-04-09 — End: 2023-08-13
  Filled 2023-04-09: qty 135, 90d supply, fill #0
  Filled 2023-07-31: qty 135, 90d supply, fill #1

## 2023-04-09 NOTE — Progress Notes (Signed)
 Patient ID: Benjamin Nunez, male    DOB: Apr 10, 1950  MRN: [id number]  CC: Hypertension (HTN f/u. Med refills. /Congestion, cough, phlegm X3 weeks/Already received flu vax)   Subjective: Benjamin Nunez is a 73 y.o. male who presents for chronic ds management. His concerns today include:  Pt with hx of HTN, adenomatous colon polyps, OA shoulders, ED.    C/o cough x 3 wks, thinks wife was sick around same time Dry cough.  No drainage at back of throat or indigestion. No sob, congestion, sore throat or fever. Occasionally rhinorrhea Was using an OTC cough syrup which helps  HYPERTENSION Currently taking: see medication list.  On Norvasc  10 mg daily and lisinopril  15 mg daily and took already for today Med Adherence: [x]  Yes    []  No Medication side effects: []  Yes    [x]  No Adherence with salt restriction: [x]  Yes    []  No Home Monitoring?: []  Yes    [x]  No -not lately Monitoring Frequency:  Home BP results range:  SOB? []  Yes    [x]  No Chest Pain?: []  Yes    [x]  No Leg swelling?: []  Yes    [x]  No Headaches?: []  Yes    [x]  No Dizziness? []  Yes    [x]  No Comments:   Prostate CA:  thinks he has f/u with [facility] next mth.  On active survillence. Passing urine ok  Reports good appetite. No falls.   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 90 tablet 1   diclofenac  Sodium (VOLTAREN ) 1 % GEL Apply 2 grams topically 4 (four) times daily. 100 g 5   lisinopril  (ZESTRIL ) 10 MG tablet Take 1.5 tablets (15  mg total) by mouth daily. 135 tablet 1   traZODone  (DESYREL ) 50 MG tablet Take 0.5 tablets (25 mg total) by mouth at bedtime. 30 tablet 3   meloxicam  (MOBIC ) 15 MG tablet Take 1 tablet (15 mg total) by mouth daily. For arthritis pain (Patient not taking: Reported on 04/09/2023) 30 tablet 3   sildenafil  (VIAGRA ) 50 MG tablet TAKE 1 TABLET BY MOUTH 1/2 HR PRIOR TO SEX AS NEEDED (1 TABLET PER 24 HR) (Patient not taking: Reported on 04/09/2023) 10 tablet 0   No current facility-administered medications on file prior to visit.    No Known Allergies  Social History   Socioeconomic History   Marital status: Married    Spouse name: Not on file   Number of children: Not on file   Years of education: Not on file   Highest education level: 9th grade  Occupational History   Not on file  Tobacco Use   Smoking status: Never   Smokeless tobacco: Never  Vaping Use   Vaping status: Never Used  Substance and Sexual Activity   Alcohol use: Yes    Comment: ocasionally   Drug use: No   Sexual activity: Not on file  Other Topics Concern   Not on file  Social History Narrative   Not on file  Social Drivers of Corporate Investment Banker Strain: Low Risk  (06/08/2020)   Overall Financial Resource Strain (CARDIA)    Difficulty of Paying Living Expenses: Not very hard  Food Insecurity: No Food Insecurity (06/08/2020)   Hunger Vital Sign    Worried About Running Out of Food in the Last Year: Never true    Ran Out of Food in the Last Year: Never true  Transportation Needs: No Transportation Needs (06/08/2020)   PRAPARE - Administrator, Civil Service (Medical): No    Lack of Transportation (Non-Medical): No  Physical Activity: Insufficiently Active (06/08/2020)   Exercise Vital Sign    Days of Exercise per Week: 3 days    Minutes of Exercise per Session: 30 min  Stress: No Stress Concern Present (06/08/2020)   [employer] of Occupational Health - Occupational Stress Questionnaire     Feeling of Stress : Not at all  Social Connections: Socially Isolated (06/08/2020)   Social Connection and Isolation Panel [NHANES]    Frequency of Communication with Friends and Family: Never    Frequency of Social Gatherings with Friends and Family: Never    Attends Religious Services: Never    Database Administrator or Organizations: No    Attends Banker Meetings: Never    Marital Status: Married  Catering Manager Violence: Not At Risk (06/08/2020)   Humiliation, Afraid, Rape, and Kick questionnaire    Fear of Current or Ex-Partner: No    Emotionally Abused: No    Physically Abused: No    Sexually Abused: No    Family History  Problem Relation Age of Onset   Diabetes Brother    Hypertension Brother    Asthma Neg Hx    Cancer Neg Hx    Heart failure Neg Hx    Hyperlipidemia Neg Hx    Colon cancer Neg Hx    Colon polyps Neg Hx    Esophageal cancer Neg Hx    Stomach cancer Neg Hx    Rectal cancer Neg Hx     Past Surgical History:  Procedure Laterality Date   COLONOSCOPY     ORIF FEMUR FRACTURE  1970s   POLYPECTOMY      ROS: Review of Systems Negative except as stated above  PHYSICAL EXAM: BP 124/76 (BP Location: Left Arm, Patient Position: Sitting, Cuff Size: Normal)   Pulse 70   Temp (!) 97.5 F (36.4 C) (Oral)   Ht 5' 8 (1.727 m)   Wt 163 lb (73.9 kg)   SpO2 99%   BMI 24.78 kg/m   Wt Readings from Last 3 Encounters:  04/09/23 163 lb (73.9 kg)  12/05/22 162 lb (73.5 kg)  08/04/22 166 lb (75.3 kg)    Physical Exam   General appearance - alert, well appearing, elderly AAM and in no distress Mental status - normal mood, behavior, speech, dress, motor activity, and thought processes Mouth - mucous membranes moist, pharynx normal without lesions Neck - supple, no significant adenopathy Chest - clear to auscultation, no wheezes, rales or rhonchi, symmetric air entry Heart - normal rate, regular rhythm, normal S1, S2, no murmurs, rubs,  clicks or gallops Extremities - peripheral pulses normal, no pedal edema, no clubbing or cyanosis Skin: 5x5 cm soft moveable raised mass RT occipital area    Latest Ref Rng & Units 12/05/2022   11:54 AM 02/07/2022    2:39 PM 06/03/2021    2:52 PM  CMP  Glucose 70 - 99 mg/dL 81  83  81   BUN 8 - 27 mg/dL 9  13  10    Creatinine 0.76 - 1.27 mg/dL 9.12  9.08  9.12   Sodium 134 - 144 mmol/L 139  138  136   Potassium 3.5 - 5.2 mmol/L 4.8  4.5  4.6   Chloride 96 - 106 mmol/L 102  100  100   CO2 20 - 29 mmol/L 24  28  25    Calcium  8.6 - 10.2 mg/dL 9.3  9.6  9.5   Total Protein 6.0 - 8.5 g/dL 7.3  7.4  7.7   Total Bilirubin 0.0 - 1.2 mg/dL 0.5  0.4  0.4   Alkaline Phos 44 - 121 IU/L 59  61  61   AST 0 - 40 IU/L 14  18  17    ALT 0 - 44 IU/L 10  11  11     Lipid Panel     Component Value Date/Time   CHOL 139 12/05/2022 1154   TRIG 36 12/05/2022 1154   HDL 46 12/05/2022 1154   CHOLHDL 3.0 12/05/2022 1154   LDLCALC 84 12/05/2022 1154    CBC    Component Value Date/Time   WBC 3.4 12/05/2022 1154   RBC 4.40 12/05/2022 1154   HGB 12.9 (L) 12/05/2022 1154   HCT 37.6 12/05/2022 1154   PLT 188 12/05/2022 1154   MCV 86 12/05/2022 1154   MCH 29.3 12/05/2022 1154   MCHC 34.3 12/05/2022 1154   RDW 13.0 12/05/2022 1154    ASSESSMENT AND PLAN: 1. Essential hypertension (Primary) At goal. Continue Norvasc  and Lisinopril  - amLODipine  (NORVASC ) 10 MG tablet; Take 1 tablet (10 mg total) by mouth daily.  Dispense: 90 tablet; Refill: 1 - lisinopril  (ZESTRIL ) 10 MG tablet; Take 1.5 tablets (15 mg total) by mouth daily.  Dispense: 135 tablet; Refill: 1  2. Prostate cancer [facility]) Followed by urology.  He is on active surveillance.  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  We will keep an eye on this.  Advised to let me know if it increases in size.  4. Dry cough Most likely he had URI.  Advised that the cough can  linger for several weeks afterwards.  He is agreeable to trying some Tessalon  Perles.  Let me know if cough does not resolve in the next 2 to 3 weeks. - benzonatate  (TESSALON  PERLES) 100 MG capsule; Take 1 capsule (100 mg total) by mouth 2 (two) times daily as needed for cough.  Dispense: 20 capsule; Refill: 0    Patient was given the opportunity to ask questions.  Patient verbalized understanding of the plan and was able to repeat key elements of the plan.   This documentation was completed using Paediatric nurse.  Any transcriptional errors are unintentional.  No orders of the defined types were placed in this encounter.    Requested Prescriptions   Pending Prescriptions Disp Refills   amLODipine  (NORVASC ) 10 MG tablet 90 tablet 1    Sig: Take 1 tablet (10 mg total) by mouth daily.   lisinopril  (ZESTRIL ) 10 MG tablet 135 tablet 1    Sig: Take 1.5 tablets (15 mg total) by mouth daily.    No follow-ups on file.  Barnie Louder, MD, FACP

## 2023-04-10 ENCOUNTER — Other Ambulatory Visit: Payer: Self-pay

## 2023-07-31 ENCOUNTER — Other Ambulatory Visit: Payer: Self-pay

## 2023-08-01 ENCOUNTER — Other Ambulatory Visit: Payer: Self-pay

## 2023-08-08 DIAGNOSIS — R350 Frequency of micturition: Secondary | ICD-10-CM | POA: Diagnosis not present

## 2023-08-13 ENCOUNTER — Encounter: Payer: Self-pay | Admitting: Internal Medicine

## 2023-08-13 ENCOUNTER — Other Ambulatory Visit: Payer: Self-pay

## 2023-08-13 ENCOUNTER — Ambulatory Visit: Payer: Medicare Other | Attending: Internal Medicine | Admitting: Internal Medicine

## 2023-08-13 VITALS — BP 106/65 | HR 73 | Temp 97.7°F | Ht 68.0 in | Wt 161.0 lb

## 2023-08-13 DIAGNOSIS — Z23 Encounter for immunization: Secondary | ICD-10-CM

## 2023-08-13 DIAGNOSIS — I10 Essential (primary) hypertension: Secondary | ICD-10-CM | POA: Diagnosis not present

## 2023-08-13 DIAGNOSIS — D170 Benign lipomatous neoplasm of skin and subcutaneous tissue of head, face and neck: Secondary | ICD-10-CM | POA: Diagnosis not present

## 2023-08-13 DIAGNOSIS — C61 Malignant neoplasm of prostate: Secondary | ICD-10-CM | POA: Diagnosis not present

## 2023-08-13 MED ORDER — ZOSTER VAC RECOMB ADJUVANTED 50 MCG/0.5ML IM SUSR: 0.5000 mL | Freq: Once | INTRAMUSCULAR | 0 refills | Status: AC

## 2023-08-13 MED ORDER — ZOSTER VAC RECOMB ADJUVANTED 50 MCG/0.5ML IM SUSR
0.5000 mL | Freq: Once | INTRAMUSCULAR | 0 refills | Status: DC
Start: 2023-08-13 — End: 2023-08-13

## 2023-08-13 MED ORDER — LISINOPRIL 10 MG PO TABS
15.0000 mg | ORAL_TABLET | Freq: Every day | ORAL | 1 refills | Status: DC
  Filled 2023-08-13 – 2023-11-02 (×2): qty 135, 90d supply, fill #0
  Filled 2024-01-23: qty 135, 90d supply, fill #1

## 2023-08-13 MED ORDER — AMLODIPINE BESYLATE 10 MG PO TABS
10.0000 mg | ORAL_TABLET | Freq: Every day | ORAL | 1 refills | Status: DC
  Filled 2023-08-13 – 2023-11-02 (×2): qty 90, 90d supply, fill #0
  Filled 2024-01-23: qty 90, 90d supply, fill #1

## 2023-08-13 NOTE — Patient Instructions (Signed)
 We discussed and I have recommended that you get the shingles vaccine series.  I have printed a prescription and given it to you to take to your pharmacy to get the first Shingrix vaccine.  Please have them send me notification once it has been administered.  We will continue to monitor the lipoma at the back of the head on the right side.  If this increases in size, please let me know so that we can refer you to a surgeon to have it removed.

## 2023-08-13 NOTE — Progress Notes (Signed)
 Patient ID: Benjamin Nunez, male   DOB: 1950/12/09, 73 y.o.   MRN: [id number]    Patient ID: Benjamin Nunez, male    DOB: 1950/08/07  MRN: [id number]  CC: Hypertension (HTN f/u. Benjamin Nunez that urologist informed pt that all is normal Benjamin Nunez to shingles vax)   Subjective: Benjamin Nunez is a 73 y.o. male who presents for chronic ds management. His concerns today include:  Pt with hx of HTN, adenomatous colon polyps, OA shoulders, ED, Prostate CA followed by [facility] on active surveillance.    HYPERTENSION Currently taking: see medication list -he is on amlodipine  10 mg daily and lisinopril  15 mg daily Med Adherence: [x]  Yes    []  No Medication side effects: []  Yes    [x]  No Adherence with salt restriction: [x]  Yes    []  No Home Monitoring?: [x]  Yes  occasionally  []  No Monitoring Frequency:  Home BP results range:  SOB? []  Yes    [x]  No Chest Pain?: []  Yes    [x]  No Leg swelling?: []  Yes    [x]  No Headaches?: []  Yes    [x]  No Dizziness? []  Yes    [x]  No Comments:  eating and sleeping okay.  Walks 3 days a wk for 1-1.5 hr. No falls.    Prostate CA: He recently followed up with [facility] earlier this month.  He is still on active surveillance.  Plan is for follow-up again in 6 months with PSA level. Passing urine okay.  HM: Due for Medicare wellness visit and shingles vaccine series. 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 90 tablet 1    benzonatate  (TESSALON  PERLES) 100 MG capsule Take 1 capsule (100 mg total) by mouth 2 (two) times daily as needed for cough. 20 capsule 0   diclofenac  Sodium (VOLTAREN ) 1 % GEL Apply 2 grams topically 4 (four) times daily. 100 g 5   lisinopril  (ZESTRIL ) 10 MG tablet Take 1.5 tablets (15 mg total) by mouth daily. 135 tablet 1   traZODone  (DESYREL ) 50 MG tablet Take 0.5 tablets (25 mg total) by mouth at bedtime. 30 tablet 3   meloxicam  (MOBIC ) 15 MG tablet Take 1 tablet (15 mg total) by mouth daily. For arthritis pain (Patient not taking: Reported on 12/05/2022) 30 tablet 3   sildenafil  (VIAGRA ) 50 MG tablet TAKE 1 TABLET BY MOUTH 1/2 HR PRIOR TO SEX AS NEEDED (1 TABLET PER 24 HR) (Patient not taking: Reported on 04/03/2022) 10 tablet 0   No current facility-administered medications on file prior to visit.    No Known Allergies  Social History   Socioeconomic History   Marital status: Married    Spouse name: Not on file   Number of children: Not on file   Years of education: Not on file   Highest education level: 9th grade  Occupational History   Not on file  Tobacco Use   Smoking status: Never   Smokeless tobacco: Never  Vaping Use   Vaping status: Never Used  Substance and Sexual Activity   Alcohol use: Yes    Comment: ocasionally   Drug use: No   Sexual activity: Not on file  Other Topics Concern   Not on file  Social History Narrative   Not on file   Social Drivers of Health   Financial Resource Strain: Low Risk  (06/08/2020)   Overall Financial Resource Strain (CARDIA)    Difficulty of Paying Living Expenses: Not very hard  Food Insecurity: No Food Insecurity (06/08/2020)   Hunger Vital Sign    Worried About Running Out of Food in the Last Year: Never true    Ran Out of Food in the Last Year: Never true  Transportation Needs: No Transportation Needs (06/08/2020)   PRAPARE - Administrator, Civil Service (Medical): No    Lack of Transportation (Non-Medical):  No  Physical Activity: Insufficiently Active (06/08/2020)   Exercise Vital Sign    Days of Exercise per Week: 3 days    Minutes of Exercise per Session: 30 min  Stress: No Stress Concern Present (06/08/2020)   [employer] of Occupational Health - Occupational Stress Questionnaire    Feeling of Stress : Not at all  Social Connections: Socially Isolated (06/08/2020)   Social Connection and Isolation Panel [NHANES]    Frequency of Communication with Friends and Family: Never    Frequency of Social Gatherings with Friends and Family: Never    Attends Religious Services: Never    Database administrator or Organizations: No    Attends Banker Meetings: Never    Marital Status: Married  Catering manager Violence: Not At Risk (06/08/2020)   Humiliation, Afraid, Rape, and Kick questionnaire    Fear of Current or Ex-Partner: No    Emotionally Abused: No    Physically Abused: No    Sexually Abused: No    Family History  Problem Relation Age of Onset   Diabetes Brother    Hypertension Brother    Asthma Neg Hx    Cancer Neg Hx    Heart failure Neg Hx    Hyperlipidemia Neg Hx    Colon cancer Neg Hx    Colon polyps Neg Hx    Esophageal cancer Neg Hx    Stomach cancer Neg Hx    Rectal cancer Neg Hx     Past Surgical History:  Procedure Laterality Date   COLONOSCOPY     ORIF FEMUR FRACTURE  1970s   POLYPECTOMY      ROS: Review of Systems Negative except as stated above  PHYSICAL EXAM: BP 106/65 (BP Location: Left Arm, Patient Position: Sitting, Cuff Size: Normal)   Pulse 73   Temp 97.7 F (36.5 C) (Oral)   Ht 5\' 8"  (1.727 m)   Wt 161 lb (73 kg)   SpO2 95%   BMI 24.48 kg/m   Wt Readings from Last 3 Encounters:  08/13/23 161 lb (73 kg)  04/09/23 163 lb (73.9 kg)  12/05/22 162 lb (73.5 kg)    Physical Exam   General appearance - alert, well appearing, and in no distress Mental status - normal mood, behavior, speech, dress, motor activity, and thought  processes Chest - clear to auscultation, no wheezes, rales or rhonchi, symmetric air entry Heart - normal rate, regular rhythm, normal S1, S2, no murmurs, rubs, clicks or gallops Extremities - peripheral pulses normal, no pedal edema, no clubbing or cyanosis Skin -patient noted to have a 6  x 6 cm raised soft movable mass at the back of the head on the right side.  Patient states he has had this for quite a number of years and does not feel that it has increased in size.  It is not bothersome to him.     Latest Ref Rng & Units 12/05/2022   11:54 AM 02/07/2022    2:39 PM 06/03/2021    2:52 PM  CMP  Glucose 70 - 99 mg/dL 81  83  81   BUN 8 - 27 mg/dL 9  13  10    Creatinine 0.76 - 1.27 mg/dL 1.61  0.96  0.45   Sodium 134 - 144 mmol/L 139  138  136   Potassium 3.5 - 5.2 mmol/L 4.8  4.5  4.6   Chloride 96 - 106 mmol/L 102  100  100   CO2 20 - 29 mmol/L 24  28  25    Calcium  8.6 - 10.2 mg/dL 9.3  9.6  9.5   Total Protein 6.0 - 8.5 g/dL 7.3  7.4  7.7   Total Bilirubin 0.0 - 1.2 mg/dL 0.5  0.4  0.4   Alkaline Phos 44 - 121 IU/L 59  61  61   AST 0 - 40 IU/L 14  18  17    ALT 0 - 44 IU/L 10  11  11     Lipid Panel     Component Value Date/Time   CHOL 139 12/05/2022 1154   TRIG 36 12/05/2022 1154   HDL 46 12/05/2022 1154   CHOLHDL 3.0 12/05/2022 1154   LDLCALC 84 12/05/2022 1154    CBC    Component Value Date/Time   WBC 3.4 12/05/2022 1154   RBC 4.40 12/05/2022 1154   HGB 12.9 (L) 12/05/2022 1154   HCT 37.6 12/05/2022 1154   PLT 188 12/05/2022 1154   MCV 86 12/05/2022 1154   MCH 29.3 12/05/2022 1154   MCHC 34.3 12/05/2022 1154   RDW 13.0 12/05/2022 1154    ASSESSMENT AND PLAN: 1. Essential hypertension (Primary) At goal.  Continue Lisinopril  15 mg and Norvasc  10 mg daily - lisinopril  (ZESTRIL ) 10 MG tablet; Take 1.5 tablets (15 mg total) by mouth daily.  Dispense: 135 tablet; Refill: 1 - amLODipine  (NORVASC ) 10 MG tablet; Take 1 tablet (10 mg total) by mouth daily.  Dispense: 90  tablet; Refill: 1  2. Prostate cancer [facility]) Being actively surveilled by his urologist.  3. Lipoma of head Patient states he has had this for quite a number of years and does not feel that it has increased in size.  It is not bothersome to him. Continue to monitor. Let me know if it starts to increase in size or becomes bothersome  4. Need for shingles vaccine I recommend getting the Shingrix vaccine series.  He is agreeable to this.  Rxn printed and given to pt to take to his pharmacy to get the 1st shot. - Zoster Vaccine Adjuvanted [facility]) injection; Inject 0.5 mLs into the muscle once for 1 dose.  Dispense: 0.5 mL; Refill: 0    Patient was given the opportunity to ask questions.  Patient verbalized understanding of the plan and was able to repeat key elements of the plan.   This documentation was completed using Paediatric nurse.  Any transcriptional errors are unintentional.  No orders of the defined types were placed in this encounter.    Requested Prescriptions    No prescriptions requested or ordered in this encounter  No follow-ups on file.  Concetta Dee, MD, FACP

## 2023-10-16 ENCOUNTER — Other Ambulatory Visit: Payer: Self-pay

## 2023-11-02 ENCOUNTER — Other Ambulatory Visit: Payer: Self-pay | Admitting: Internal Medicine

## 2023-11-02 ENCOUNTER — Other Ambulatory Visit: Payer: Self-pay

## 2023-11-02 DIAGNOSIS — G4700 Insomnia, unspecified: Secondary | ICD-10-CM

## 2023-11-02 MED ORDER — TRAZODONE HCL 50 MG PO TABS
25.0000 mg | ORAL_TABLET | Freq: Every day | ORAL | 3 refills | Status: AC
Start: 2023-11-02 — End: ?
  Filled 2023-11-02: qty 30, 60d supply, fill #0

## 2023-11-27 ENCOUNTER — Ambulatory Visit: Attending: Internal Medicine

## 2023-12-13 ENCOUNTER — Telehealth: Payer: Self-pay | Admitting: Internal Medicine

## 2023-12-13 NOTE — Telephone Encounter (Signed)
 Lvm to confirm appt for 9/19

## 2023-12-14 ENCOUNTER — Encounter: Payer: Self-pay | Admitting: Internal Medicine

## 2023-12-14 ENCOUNTER — Ambulatory Visit: Attending: Internal Medicine | Admitting: Internal Medicine

## 2023-12-14 VITALS — BP 129/67 | HR 56 | Ht 68.0 in | Wt 164.0 lb

## 2023-12-14 DIAGNOSIS — Z23 Encounter for immunization: Secondary | ICD-10-CM

## 2023-12-14 DIAGNOSIS — I10 Essential (primary) hypertension: Secondary | ICD-10-CM | POA: Diagnosis not present

## 2023-12-14 DIAGNOSIS — Z79899 Other long term (current) drug therapy: Secondary | ICD-10-CM

## 2023-12-14 NOTE — Progress Notes (Signed)
 Patient ID: Benjamin Nunez, male    DOB: 08/25/1950  MRN: [id number]  CC: Hypertension (HTN f/u.//No questions / concerns/Flu vax administered on 12/14/2023 - C.A.)   Subjective: Benjamin Nunez is a 73 y.o. male who presents for chronic ds management. His concerns today include:  Pt with hx of HTN, adenomatous colon polyps, OA shoulders, ED, Prostate CA followed by [facility] on active surveillance.    Discussed the use of AI scribe software for clinical note transcription with the patient, who gave verbal consent to proceed.  History of Present Illness Benjamin Nunez is a 73 year old male with hypertension who presents for a four-month follow-up visit.  He has not experienced any major issues since his last visit in May. He occasionally checks his blood pressure at home and describes it as 'real good'.  He is taking lisinopril  15 mg and amlodipine  10 mg every morning. No chest pain, shortness of breath, or leg swelling. He maintains a healthy diet, consuming more fruits, cereals, and greens, and enjoys vegetables like green beans and peas. He exercises about three days a week, walking in the neighborhood either in the morning or late afternoon, avoiding the sun. His weight has slightly increased from 161 lbs to 164 lbs since the last visit, but he feels he is maintaining it well.  He missed a Medicare Wellness appointment but has been in contact with the office to reschedule. He has completed his shingles vaccination. He wears compression socks and reports his shoulders are 'really good'. He is scheduled to see his urologist in November for a PSA level check.      Patient Active Problem List   Diagnosis Date Noted   Prostate cancer (HCC) 11/01/2022   Elevated PSA 05/19/2020   Erectile dysfunction 05/18/2020   Primary osteoarthritis of left shoulder 01/16/2020   Acute pain of left shoulder 10/22/2018   Over weight 04/02/2018   Adenomatous polyp of colon 03/02/2017    Essential hypertension 09/18/2016     Current Outpatient Medications on File Prior to Visit  Medication Sig Dispense Refill   amLODipine  (NORVASC ) 10 MG tablet Take 1 tablet (10 mg total) by mouth daily. 90 tablet 1   diclofenac  Sodium (VOLTAREN ) 1 % GEL Apply 2 grams topically 4 (four) times daily. 100 g 5   lisinopril  (ZESTRIL ) 10 MG tablet Take 1.5 tablets (15 mg total) by mouth daily. 135 tablet 1   sildenafil  (VIAGRA ) 50 MG tablet TAKE 1 TABLET BY MOUTH 1/2 HR PRIOR TO SEX AS NEEDED (1 TABLET PER 24 HR) 10 tablet 0   traZODone  (DESYREL ) 50 MG tablet Take 0.5 tablets (25 mg total) by mouth at bedtime. 30 tablet 3   meloxicam  (MOBIC ) 15 MG tablet Take 1 tablet (15 mg total) by mouth daily. For arthritis pain (Patient not taking: Reported on 12/14/2023) 30 tablet 3   No current facility-administered medications on file prior to visit.    No Known Allergies  Social History   Socioeconomic History   Marital status: Married    Spouse name: Not on file   Number of children: Not on file   Years of education: Not on file   Highest education level: 9th grade  Occupational History   Not on file  Tobacco Use   Smoking status: Never   Smokeless tobacco: Never  Vaping Use   Vaping status: Never Used  Substance and Sexual Activity   Alcohol use: Yes    Comment: ocasionally   Drug use: No  Sexual activity: Not on file  Other Topics Concern   Not on file  Social History Narrative   Not on file   Social Drivers of Health   Financial Resource Strain: Low Risk  (06/08/2020)   Overall Financial Resource Strain (CARDIA)    Difficulty of Paying Living Expenses: Not very hard  Food Insecurity: No Food Insecurity (06/08/2020)   Hunger Vital Sign    Worried About Running Out of Food in the Last Year: Never true    Ran Out of Food in the Last Year: Never true  Transportation Needs: No Transportation Needs (06/08/2020)   PRAPARE - Administrator, Civil Service (Medical): No     Lack of Transportation (Non-Medical): No  Physical Activity: Insufficiently Active (06/08/2020)   Exercise Vital Sign    Days of Exercise per Week: 3 days    Minutes of Exercise per Session: 30 min  Stress: No Stress Concern Present (06/08/2020)   [employer] of Occupational Health - Occupational Stress Questionnaire    Feeling of Stress : Not at all  Social Connections: Socially Isolated (06/08/2020)   Social Connection and Isolation Panel    Frequency of Communication with Friends and Family: Never    Frequency of Social Gatherings with Friends and Family: Never    Attends Religious Services: Never    Database administrator or Organizations: No    Attends Banker Meetings: Never    Marital Status: Married  Catering manager Violence: Not At Risk (06/08/2020)   Humiliation, Afraid, Rape, and Kick questionnaire    Fear of Current or Ex-Partner: No    Emotionally Abused: No    Physically Abused: No    Sexually Abused: No    Family History  Problem Relation Age of Onset   Diabetes Brother    Hypertension Brother    Asthma Neg Hx    Cancer Neg Hx    Heart failure Neg Hx    Hyperlipidemia Neg Hx    Colon cancer Neg Hx    Colon polyps Neg Hx    Esophageal cancer Neg Hx    Stomach cancer Neg Hx    Rectal cancer Neg Hx     Past Surgical History:  Procedure Laterality Date   COLONOSCOPY     ORIF FEMUR FRACTURE  1970s   POLYPECTOMY      ROS: Review of Systems Negative except as stated above  PHYSICAL EXAM: BP 129/67 (BP Location: Left Arm, Patient Position: Sitting, Cuff Size: Normal)   Pulse (!) 56   Ht 5' 8 (1.727 m)   Wt 164 lb (74.4 kg)   SpO2 99%   BMI 24.94 kg/m   Wt Readings from Last 3 Encounters:  12/14/23 164 lb (74.4 kg)  08/13/23 161 lb (73 kg)  04/09/23 163 lb (73.9 kg)    Physical Exam   General appearance - alert, well appearing, older AAM and in no distress Mental status - normal mood, behavior, speech, dress, motor activity,  and thought processes Neck - supple, no significant adenopathy Chest - clear to auscultation, no wheezes, rales or rhonchi, symmetric air entry Heart - normal rate, regular rhythm, normal S1, S2, no murmurs, rubs, clicks or gallops Extremities - peripheral pulses normal, no pedal edema, no clubbing or cyanosis     Latest Ref Rng & Units 12/05/2022   11:54 AM 02/07/2022    2:39 PM 06/03/2021    2:52 PM  CMP  Glucose 70 - 99 mg/dL 81  83  81   BUN 8 - 27 mg/dL 9  13  10    Creatinine 0.76 - 1.27 mg/dL 9.12  9.08  9.12   Sodium 134 - 144 mmol/L 139  138  136   Potassium 3.5 - 5.2 mmol/L 4.8  4.5  4.6   Chloride 96 - 106 mmol/L 102  100  100   CO2 20 - 29 mmol/L 24  28  25    Calcium  8.6 - 10.2 mg/dL 9.3  9.6  9.5   Total Protein 6.0 - 8.5 g/dL 7.3  7.4  7.7   Total Bilirubin 0.0 - 1.2 mg/dL 0.5  0.4  0.4   Alkaline Phos 44 - 121 IU/L 59  61  61   AST 0 - 40 IU/L 14  18  17    ALT 0 - 44 IU/L 10  11  11     Lipid Panel     Component Value Date/Time   CHOL 139 12/05/2022 1154   TRIG 36 12/05/2022 1154   HDL 46 12/05/2022 1154   CHOLHDL 3.0 12/05/2022 1154   LDLCALC 84 12/05/2022 1154    CBC    Component Value Date/Time   WBC 3.4 12/05/2022 1154   RBC 4.40 12/05/2022 1154   HGB 12.9 (L) 12/05/2022 1154   HCT 37.6 12/05/2022 1154   PLT 188 12/05/2022 1154   MCV 86 12/05/2022 1154   MCH 29.3 12/05/2022 1154   MCHC 34.3 12/05/2022 1154   RDW 13.0 12/05/2022 1154    ASSESSMENT AND PLAN: 1. Essential hypertension (Primary) At goal.  Continue Norvasc  10 mg daily and lisinopril  15 mg daily.  Continue monitoring blood pressure at home with goal being 130/80 or lower. - CBC - Comprehensive metabolic panel with GFR - Lipid panel  2. Need for influenza vaccination Flu shot given today  Patient was given the opportunity to ask questions.  Patient verbalized understanding of the plan and was able to repeat key elements of the plan.   This documentation was completed using Dietitian.  Any transcriptional errors are unintentional.  Orders Placed This Encounter  Procedures   Flu vaccine HIGH DOSE PF(Fluzone Trivalent)   CBC   Comprehensive metabolic panel with GFR   Lipid panel     Requested Prescriptions    No prescriptions requested or ordered in this encounter    Return in about 4 months (around 04/14/2024) for Medicare Wellness Visit in 2-6   wks with CMA or LPN.  Barnie Louder, MD, FACP

## 2023-12-14 NOTE — Patient Instructions (Signed)
 VISIT SUMMARY:  Benjamin Nunez, you had a follow-up visit today to check on your hypertension and overall health. Your blood pressure is well-controlled, and you are doing a great job with your medication and lifestyle habits. We discussed your diet, exercise routine, and upcoming medical appointments.  YOUR PLAN:  -ESSENTIAL HYPERTENSION: Essential hypertension means high blood pressure without a known secondary cause. Your blood pressure is well-controlled at 129/67 mmHg. Continue taking lisinopril  15 mg and amlodipine  10 mg every morning. Keep monitoring your blood pressure at home, maintain your healthy diet, and continue your regular exercise routine.  -GENERAL HEALTH MAINTENANCE: We discussed the importance of getting your annual flu shot and routine blood tests to check your cholesterol, kidney, and liver function. We also talked about rescheduling your Medicare Wellness visit.  INSTRUCTIONS:  Please get your flu shot today. We have ordered blood tests for cholesterol, kidney, and liver function, so please complete these tests as soon as possible. Also, make sure to reschedule your Medicare Wellness visit for about a month from now.

## 2023-12-15 ENCOUNTER — Ambulatory Visit: Payer: Self-pay | Admitting: Internal Medicine

## 2023-12-15 LAB — CBC
Hematocrit: 40.2 % (ref 37.5–51.0)
Hemoglobin: 13 g/dL (ref 13.0–17.7)
MCH: 29.1 pg (ref 26.6–33.0)
MCHC: 32.3 g/dL (ref 31.5–35.7)
MCV: 90 fL (ref 79–97)
Platelets: 212 x10E3/uL (ref 150–450)
RBC: 4.46 x10E6/uL (ref 4.14–5.80)
RDW: 13.9 % (ref 11.6–15.4)
WBC: 4.1 x10E3/uL (ref 3.4–10.8)

## 2023-12-15 LAB — COMPREHENSIVE METABOLIC PANEL WITH GFR
ALT: 15 IU/L (ref 0–44)
AST: 20 IU/L (ref 0–40)
Albumin: 4.8 g/dL (ref 3.8–4.8)
Alkaline Phosphatase: 55 IU/L (ref 47–123)
BUN/Creatinine Ratio: 13 (ref 10–24)
BUN: 10 mg/dL (ref 8–27)
Bilirubin Total: 0.7 mg/dL (ref 0.0–1.2)
CO2: 22 mmol/L (ref 20–29)
Calcium: 9.7 mg/dL (ref 8.6–10.2)
Chloride: 100 mmol/L (ref 96–106)
Creatinine, Ser: 0.77 mg/dL (ref 0.76–1.27)
Globulin, Total: 2.8 g/dL (ref 1.5–4.5)
Glucose: 76 mg/dL (ref 70–99)
Potassium: 4.4 mmol/L (ref 3.5–5.2)
Sodium: 139 mmol/L (ref 134–144)
Total Protein: 7.6 g/dL (ref 6.0–8.5)
eGFR: 95 mL/min/1.73 (ref >59)

## 2023-12-15 LAB — LIPID PANEL
Chol/HDL Ratio: 3.3 ratio (ref 0.0–5.0)
Cholesterol, Total: 169 mg/dL (ref 100–199)
HDL: 51 mg/dL (ref >39)
LDL Chol Calc (NIH): 109 mg/dL — ABNORMAL HIGH (ref 0–99)
Triglycerides: 42 mg/dL (ref 0–149)
VLDL Cholesterol Cal: 9 mg/dL (ref 5–40)

## 2023-12-18 ENCOUNTER — Ambulatory Visit: Payer: Self-pay

## 2023-12-18 NOTE — Telephone Encounter (Signed)
 noted

## 2023-12-18 NOTE — Telephone Encounter (Signed)
 FYI Only or Action Required?: FYI only for provider.  Patient was last seen in primary care on 12/14/2023 by Vicci Barnie NOVAK, MD.  Called Nurse Triage reporting Advice Only.  Symptoms began n/a.  Interventions attempted: Other: n/a.  Symptoms are: n/a.  Triage Disposition: Information or Advice Only Call  Patient/caregiver understands and will follow disposition?: Yes   Copied from CRM #[id number]. Topic: Clinical - Pink Word Triage >> Dec 18, 2023  1:56 PM Geneva B wrote: Reason for Triage: patient calling for lab results office told me to triage to get results Reason for Disposition  Health information question, no triage required and triager able to answer question  Answer Assessment - Initial Assessment Questions 1. REASON FOR CALL: What is the main reason for your call? or How can I best help you?     Lab result 2. SYMPTOMS : Do you have any symptoms?      N/A  Lab results read to patient per Dr. CHARM Vicci and patient verbalizes results     Kidney and liver function tests are good. Blood cell counts are normal. Cholesterol level mildly elevated at 109 with goal being less than 100.  Try to avoid fried foods.  Cook with healthy oils like olive oil.  Continue regular exercise. Will plan to recheck on subsequent visit.   The 10-year ASCVD risk score (Arnett DK, et al., 2019) is: 19.5%   Values used to calculate the score:     Age: 73 years     Clincally relevant sex: Male     Is Non-Hispanic African American: Yes     Diabetic: No     Tobacco smoker: No     Systolic Blood Pressure: 129 mmHg     Is BP treated: Yes     HDL Cholesterol: 51 mg/dL     Total Cholesterol: 169 mg/dL  Protocols used: Information Only Call - No Triage-A-AH

## 2024-01-23 ENCOUNTER — Other Ambulatory Visit: Payer: Self-pay

## 2024-03-04 ENCOUNTER — Other Ambulatory Visit: Payer: Self-pay | Admitting: Adult Health

## 2024-03-04 DIAGNOSIS — C61 Malignant neoplasm of prostate: Secondary | ICD-10-CM

## 2024-04-15 ENCOUNTER — Ambulatory Visit: Attending: Internal Medicine | Admitting: Internal Medicine

## 2024-04-15 ENCOUNTER — Other Ambulatory Visit: Payer: Self-pay

## 2024-04-15 VITALS — BP 120/67 | HR 79 | Temp 97.5°F | Ht 68.0 in | Wt 160.0 lb

## 2024-04-15 DIAGNOSIS — I10 Essential (primary) hypertension: Secondary | ICD-10-CM | POA: Diagnosis not present

## 2024-04-15 DIAGNOSIS — C61 Malignant neoplasm of prostate: Secondary | ICD-10-CM

## 2024-04-15 DIAGNOSIS — E782 Mixed hyperlipidemia: Secondary | ICD-10-CM

## 2024-04-15 MED ORDER — AMLODIPINE BESYLATE 10 MG PO TABS
10.0000 mg | ORAL_TABLET | Freq: Every day | ORAL | 1 refills | Status: AC
  Filled 2024-04-15: qty 90, 90d supply, fill #0

## 2024-04-15 MED ORDER — LISINOPRIL 10 MG PO TABS
15.0000 mg | ORAL_TABLET | Freq: Every day | ORAL | 1 refills | Status: AC
  Filled 2024-04-15: qty 135, 90d supply, fill #0

## 2024-04-15 NOTE — Progress Notes (Signed)
 "   Patient ID: Benjamin Nunez, male    DOB: 10/22/1950  MRN: [id number]  CC: Hypertension (HTN f/u. /No questions / concerns/Already received flu vax)   Subjective: Benjamin Nunez is a 74 y.o. male who presents for chronic ds management. His chronic medical issues include:  Pt with hx of HTN, adenomatous colon polyps, OA shoulders, ED, Prostate CA followed by [facility] on active surveillance.    Discussed the use of AI scribe software for clinical note transcription with the patient, who gave verbal consent to proceed.  History of Present Illness Benjamin Nunez is a 74 year old male with hypertension who presents for a four-month follow-up visit.  His blood pressure today was 120/67 mmHg, and he continues to take amlodipine  10 mg daily and lisinopril  15 mg daily. He monitors his blood pressure at home every two weeks and describes it as 'pretty good'. He is trying to improve his diet by eating more fruits and vegetables, such as cabbage and green beans. No chest pain, shortness of breath, headaches, or dizziness. Due to cold weather, he has not been walking outdoors, which he enjoys. He does not have a treadmill at home. He sometimes walks back and forth to the garage for activity.  He follows with [facility] for prostate cancer and has a follow-up MRI scheduled for June 1st. His PSA levels were recently checked and the patient reports they were stable. He is under active surveillance  In September, blood tests showed normal kidney and liver function, normal blood cell counts, but an LDL cholesterol level of 109 mg/dL, which is above the target of less than 100 mg/dL. He primarily eats pork chops and chicken and has been advised to reduce pork and beef intake in favor of lean meats like chicken, turkey, and seafood.  He has received his flu shot. He is overdue for his Medicare wellness visit, which was previously scheduled for September but not completed.    Patient  Active Problem List   Diagnosis Date Noted   Prostate cancer (HCC) 11/01/2022   Elevated PSA 05/19/2020   Erectile dysfunction 05/18/2020   Primary osteoarthritis of left shoulder 01/16/2020   Acute pain of left shoulder 10/22/2018   Over weight 04/02/2018   Adenomatous polyp of colon 03/02/2017   Essential hypertension 09/18/2016     Medications Ordered Prior to Encounter[1]  Allergies[2]  Social History   Socioeconomic History   Marital status: Married    Spouse name: Not on file   Number of children: Not on file   Years of education: Not on file   Highest education level: 9th grade  Occupational History   Not on file  Tobacco Use   Smoking status: Never   Smokeless tobacco: Never  Vaping Use   Vaping status: Never Used  Substance and Sexual Activity   Alcohol use: Yes    Comment: ocasionally   Drug use: No   Sexual activity: Not on file  Other Topics Concern   Not on file  Social History Narrative   Not on file   Social Drivers of Health   Tobacco Use: Low Risk (12/14/2023)   Patient History    Smoking Tobacco Use: Never    Smokeless Tobacco Use: Never    Passive Exposure: Not on file  Financial Resource Strain: Not on file  Food Insecurity: Not on file  Transportation Needs: Not on file  Physical Activity: Not on file  Stress: Not on file  Social Connections: Not on file  Intimate Partner Violence: Not on file  Depression (PHQ2-9): Low Risk (12/14/2023)   Depression (PHQ2-9)    PHQ-2 Score: 2  Alcohol Screen: Not on file  Housing: Not on file  Utilities: Not on file  Health Literacy: Not on file    Family History  Problem Relation Age of Onset   Diabetes Brother    Hypertension Brother    Asthma Neg Hx    Cancer Neg Hx    Heart failure Neg Hx    Hyperlipidemia Neg Hx    Colon cancer Neg Hx    Colon polyps Neg Hx    Esophageal cancer Neg Hx    Stomach cancer Neg Hx    Rectal cancer Neg Hx     Past Surgical History:  Procedure Laterality  Date   COLONOSCOPY     ORIF FEMUR FRACTURE  1970s   POLYPECTOMY      ROS: Review of Systems Negative except as stated above  PHYSICAL EXAM: BP 120/67 (BP Location: Left Arm, Patient Position: Sitting, Cuff Size: Normal)   Pulse 79   Temp (!) 97.5 F (36.4 C) (Oral)   Ht 5' 8 (1.727 m)   Wt 160 lb (72.6 kg)   SpO2 99%   BMI 24.33 kg/m   Wt Readings from Last 3 Encounters:  04/15/24 160 lb (72.6 kg)  12/14/23 164 lb (74.4 kg)  08/13/23 161 lb (73 kg)    Physical Exam General appearance - alert, well appearing, older AAM and in no distress Mental status - normal mood, behavior, speech, dress, motor activity, and thought processes Neck - supple, no significant adenopathy Chest - clear to auscultation, no wheezes, rales or rhonchi, symmetric air entry Heart - normal rate, regular rhythm, normal S1, S2, no murmurs, rubs, clicks or gallops Extremities - peripheral pulses normal, no pedal edema, no clubbing or cyanosis     Latest Ref Rng & Units 12/14/2023   12:10 PM 12/05/2022   11:54 AM 02/07/2022    2:39 PM  CMP  Glucose 70 - 99 mg/dL 76  81  83   BUN 8 - 27 mg/dL 10  9  13    Creatinine 0.76 - 1.27 mg/dL 9.22  9.12  9.08   Sodium 134 - 144 mmol/L 139  139  138   Potassium 3.5 - 5.2 mmol/L 4.4  4.8  4.5   Chloride 96 - 106 mmol/L 100  102  100   CO2 20 - 29 mmol/L 22  24  28    Calcium  8.6 - 10.2 mg/dL 9.7  9.3  9.6   Total Protein 6.0 - 8.5 g/dL 7.6  7.3  7.4   Total Bilirubin 0.0 - 1.2 mg/dL 0.7  0.5  0.4   Alkaline Phos 47 - 123 IU/L 55  59  61   AST 0 - 40 IU/L 20  14  18    ALT 0 - 44 IU/L 15  10  11     Lipid Panel     Component Value Date/Time   CHOL 169 12/14/2023 1210   TRIG 42 12/14/2023 1210   HDL 51 12/14/2023 1210   CHOLHDL 3.3 12/14/2023 1210   LDLCALC 109 (H) 12/14/2023 1210    CBC    Component Value Date/Time   WBC 4.1 12/14/2023 1210   RBC 4.46 12/14/2023 1210   HGB 13.0 12/14/2023 1210   HCT 40.2 12/14/2023 1210   PLT 212 12/14/2023 1210    MCV 90 12/14/2023 1210   MCH 29.1 12/14/2023 1210  MCHC 32.3 12/14/2023 1210   RDW 13.9 12/14/2023 1210    ASSESSMENT AND PLAN:  Assessment and Plan Assessment & Plan Essential hypertension At goal. - Continue amlodipine  10 mg daily. - Continue lisinopril  15 mg daily. - Recommended indoor exercise such as marching in place while watching TV when too cold to walk outdoors.  Mixed hyperlipidemia LDL cholesterol slightly elevated at 109 mg/dL. Dietary modifications discussed to reduce LDL. - Advised avoiding fried and greasy foods. - Recommended using olive oil for cooking. - Encouraged consumption of lean meats such as chicken, turkey, and seafood. - Advised reducing intake of beef and pork. - Will recheck cholesterol levels later this year.  Prostate CA Followed by [facility].  He is under active surveillance.   Patient was given the opportunity to ask questions.  Patient verbalized understanding of the plan and was able to repeat key elements of the plan.   This documentation was completed using Paediatric nurse.  Any transcriptional errors are unintentional.  No orders of the defined types were placed in this encounter.    Requested Prescriptions   Signed Prescriptions Disp Refills   amLODipine  (NORVASC ) 10 MG tablet 90 tablet 1    Sig: Take 1 tablet (10 mg total) by mouth daily.   lisinopril  (ZESTRIL ) 10 MG tablet 135 tablet 1    Sig: Take 1.5 tablets (15 mg total) by mouth daily.    Return in about 6 weeks (around 05/27/2024) for f/u with me in 6 weeks for Medicare Wellness Visit. SABRA Barnie Louder, MD, FACP    [1]  Current Outpatient Medications on File Prior to Visit  Medication Sig Dispense Refill   diclofenac  Sodium (VOLTAREN ) 1 % GEL Apply 2 grams topically 4 (four) times daily. 100 g 5   traZODone  (DESYREL ) 50 MG tablet Take 0.5 tablets (25 mg total) by mouth at bedtime. 30 tablet 3   meloxicam  (MOBIC ) 15 MG tablet Take 1  tablet (15 mg total) by mouth daily. For arthritis pain (Patient not taking: Reported on 04/15/2024) 30 tablet 3   sildenafil  (VIAGRA ) 50 MG tablet TAKE 1 TABLET BY MOUTH 1/2 HR PRIOR TO SEX AS NEEDED (1 TABLET PER 24 HR) (Patient not taking: Reported on 04/15/2024) 10 tablet 0   No current facility-administered medications on file prior to visit.  [2] No Known Allergies  "

## 2024-04-15 NOTE — Patient Instructions (Signed)
" °  VISIT SUMMARY: During your visit, we reviewed your blood pressure and cholesterol levels. Your blood pressure is well controlled with your current medications, and your home readings are satisfactory. We discussed your diet and exercise habits, and I provided recommendations to help you maintain your health. We also reviewed your recent lab results and made plans to monitor your cholesterol levels.  YOUR PLAN: -ESSENTIAL HYPERTENSION: Essential hypertension means high blood pressure without a known secondary cause. Your blood pressure is well controlled at 120/67 mmHg with your current medications, amlodipine  10 mg daily and lisinopril  15 mg daily. Continue taking these medications as prescribed. You are doing well with monitoring your blood pressure at home. Keep up with your dietary improvements by eating more fruits and vegetables. Since you cannot walk outdoors due to the cold weather, try indoor exercises like marching in place while watching TV.  -MIXED HYPERLIPIDEMIA: Mixed hyperlipidemia means having high levels of different types of fats in your blood, including LDL cholesterol. Your LDL cholesterol level is slightly elevated at 109 mg/dL. To help lower it, avoid fried and greasy foods, use olive oil for cooking, and eat more lean meats like chicken, turkey, and seafood. Reduce your intake of beef and pork. We will recheck your cholesterol levels later this year.  INSTRUCTIONS: Please continue with your current medications and dietary improvements. Try to incorporate indoor exercises into your routine. We will recheck your cholesterol levels later this year. Also, please reschedule your overdue Medicare wellness visit.    Contains text generated by [employer].   "

## 2024-05-27 ENCOUNTER — Ambulatory Visit: Payer: Self-pay | Admitting: Internal Medicine

## 2024-06-10 ENCOUNTER — Ambulatory Visit: Payer: Self-pay

## 2024-08-25 ENCOUNTER — Other Ambulatory Visit
